# Patient Record
Sex: Male | Born: 1990
Health system: Southern US, Community
[De-identification: ages and names within clinical notes are randomized; demographics above are authoritative.]

## PROBLEM LIST (undated history)

## (undated) HISTORY — PX: HAND SURGERY: SHX662

---

## 2013-04-15 ENCOUNTER — Emergency Department: Payer: Self-pay | Admitting: Internal Medicine

## 2013-05-08 ENCOUNTER — Emergency Department: Payer: Self-pay | Admitting: Emergency Medicine

## 2013-05-11 LAB — BETA STREP CULTURE(ARMC)

## 2015-04-08 ENCOUNTER — Emergency Department: Payer: Self-pay

## 2015-04-08 ENCOUNTER — Encounter: Payer: Self-pay | Admitting: Emergency Medicine

## 2015-04-08 ENCOUNTER — Emergency Department
Admission: EM | Admit: 2015-04-08 | Discharge: 2015-04-09 | Disposition: A | Discharge status: Home / Self Care | Payer: Self-pay | Attending: Emergency Medicine | Admitting: Emergency Medicine

## 2015-04-08 DIAGNOSIS — W228XXA Striking against or struck by other objects, initial encounter: Secondary | ICD-10-CM | POA: Insufficient documentation

## 2015-04-08 DIAGNOSIS — Y92310 Basketball court as the place of occurrence of the external cause: Secondary | ICD-10-CM | POA: Insufficient documentation

## 2015-04-08 DIAGNOSIS — F17210 Nicotine dependence, cigarettes, uncomplicated: Secondary | ICD-10-CM | POA: Insufficient documentation

## 2015-04-08 DIAGNOSIS — S6991XA Unspecified injury of right wrist, hand and finger(s), initial encounter: Secondary | ICD-10-CM

## 2015-04-08 DIAGNOSIS — Y9367 Activity, basketball: Secondary | ICD-10-CM | POA: Insufficient documentation

## 2015-04-08 DIAGNOSIS — Y998 Other external cause status: Secondary | ICD-10-CM | POA: Insufficient documentation

## 2015-04-08 DIAGNOSIS — S61214A Laceration without foreign body of right ring finger without damage to nail, initial encounter: Secondary | ICD-10-CM | POA: Insufficient documentation

## 2015-04-08 DIAGNOSIS — S61209A Unspecified open wound of unspecified finger without damage to nail, initial encounter: Secondary | ICD-10-CM

## 2015-04-08 MED ORDER — KETOROLAC TROMETHAMINE 30 MG/ML IJ SOLN
10.0000 mg | Freq: Once | INTRAMUSCULAR | Status: AC
  Administered 2015-04-08: 9.9 mg via INTRAVENOUS
  Filled 2015-04-08: qty 1

## 2015-04-08 MED ORDER — IBUPROFEN 800 MG PO TABS: 800.0000 mg | ORAL_TABLET | Freq: Three times a day (TID) | ORAL | Status: DC | PRN

## 2015-04-08 MED ORDER — OXYCODONE-ACETAMINOPHEN 5-325 MG PO TABS: 1.0000 | ORAL_TABLET | ORAL | Status: DC | PRN

## 2015-04-08 MED ORDER — OXYCODONE-ACETAMINOPHEN 5-325 MG PO TABS
ORAL_TABLET | ORAL | Status: AC
  Filled 2015-04-08: qty 1

## 2015-04-08 MED ORDER — HYDROMORPHONE HCL 1 MG/ML IJ SOLN
0.5000 mg | Freq: Once | INTRAMUSCULAR | Status: AC
  Administered 2015-04-08: 0.5 mg via INTRAVENOUS
  Filled 2015-04-08: qty 1

## 2015-04-08 MED ORDER — SODIUM CHLORIDE 0.9 % IV BOLUS (SEPSIS)
1000.0000 mL | Freq: Once | INTRAVENOUS | Status: AC
  Administered 2015-04-08: 1000 mL via INTRAVENOUS

## 2015-04-08 MED ORDER — CEPHALEXIN 500 MG PO CAPS: 500.0000 mg | ORAL_CAPSULE | Freq: Three times a day (TID) | ORAL | Status: DC

## 2015-04-08 MED ORDER — OXYCODONE-ACETAMINOPHEN 5-325 MG PO TABS
1.0000 | ORAL_TABLET | Freq: Once | ORAL | Status: AC
  Administered 2015-04-08: 1 via ORAL

## 2015-04-08 MED ORDER — ONDANSETRON HCL 4 MG/2ML IJ SOLN
INTRAMUSCULAR | Status: DC
Start: 2015-04-08 — End: 2015-04-09
  Filled 2015-04-08: qty 2

## 2015-04-08 MED ORDER — CEFAZOLIN SODIUM 1-5 GM-% IV SOLN
1.0000 g | Freq: Once | INTRAVENOUS | Status: AC
  Administered 2015-04-08: 1 g via INTRAVENOUS
  Filled 2015-04-08: qty 50

## 2015-04-08 MED ORDER — MORPHINE SULFATE (PF) 4 MG/ML IV SOLN
4.0000 mg | Freq: Once | INTRAVENOUS | Status: AC
  Administered 2015-04-08: 4 mg via INTRAVENOUS

## 2015-04-08 MED ORDER — MORPHINE SULFATE (PF) 4 MG/ML IV SOLN
INTRAVENOUS | Status: AC
  Filled 2015-04-08: qty 1

## 2015-04-08 MED ORDER — ONDANSETRON HCL 4 MG/2ML IJ SOLN
4.0000 mg | Freq: Once | INTRAMUSCULAR | Status: AC
  Administered 2015-04-08: 4 mg via INTRAVENOUS
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
## 2015-04-08 NOTE — ED Notes (Signed)
MD Sung at bedside. 
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
## 2015-04-08 NOTE — ED Notes (Signed)
Patient reports he was attempting to dunk a basketball and his finger got caught.  Patient with injury to right 4th digit with bone exposure.

## 2015-04-08 NOTE — Discharge Instructions (Signed)
1. Take antibiotic as prescribed (Keflex 500 mg 3 times daily 7 days). 2. Take pain medicines as needed (Motrin/Percocet). 3. Keep wound clean and dry. 4. Return to the ER for wound check in 2 days if you are unable to get an appointment with the hand surgeon or orthopedic doctor within that timeframe. 5. Return to the ER sooner for worsening symptoms, fever, vomiting, redness, purulent discharge or swelling to your finger, or other concerns.  Deep Skin Avulsion A deep skin avulsion is a type of open wound. It often results from a severe injury (trauma) that tears away all layers of the skin or an entire body part. The areas of the body that are most often affected by a deep skin avulsion include the face, lips, ears, nose, and fingers. A deep skin avulsion may make structures below the skin become visible. You may be able to see muscle, bone, nerves, and blood vessels. A deep skin avulsion can also damage important structures beneath the skin. These include tendons, ligaments, nerves, or blood vessels. CAUSES Injuries that often cause a deep skin avulsion include:  Being crushed.  Falling against a jagged surface.  Animal bites.  Gunshot wounds.  Severe burns.  Injuries that involve being dragged, such as bicycle or motorcycle accidents. SYMPTOMS Symptoms of a deep skin avulsion include:  Pain.  Numbness.  Swelling.  A misshapen body part.  Bleeding, which may be heavy.  Fluid leaking from the wound. DIAGNOSIS This condition may be diagnosed with a medical history and physical exam. You may also have X-rays done. TREATMENT The treatment that is chosen for a deep skin avulsion depends on how large and deep the wound is and where it is located. Treatment for all types of avulsions usually starts with:  Controlling the bleeding.  Washing out the wound with a germ-free (sterile) salt-water solution.  Removing dead tissue from the wound. A wound may be closed or left open  to heal. This depends on the size and location of the wound and whether it is likely to become infected. Wounds are usually covered or closed if they expose blood vessels, nerves, bone, or cartilage.  Wounds that are small and clean may be closed with stitches (sutures).  Wounds that cannot be closed with sutures may be covered with a piece of skin (graft) or a skin flap. Skin may be taken from on or near the wound, from another part of the body, or from a donor.  Wounds may be left open if they are hard to close or they may become infected. These wounds heal over time from the bottom up. You may also receive medicine. This may include:  Antibiotics.  A tetanus shot.  Rabies vaccine. HOME CARE INSTRUCTIONS Medicines  Take or apply over-the-counter and prescription medicines only as told by your health care provider.  If you were prescribed an antibiotic, take or apply it as told by your health care provider. Do not stop taking the antibiotic even if your condition improves.  You may get anti-itch medicine while your wound is healing. Use it only as told by your health care provider. Wound Care  There are many ways to close and cover a wound. For example, a wound can be covered with sutures, skin glue, or adhesive strips. Follow instructions from your health care provider about:  How to take care of your wound.  When and how you should change your bandage (dressing).  When you should remove your dressing.  Removing whatever was  used to close your wound.  Keep the dressing dry as told by your health care provider. Do not take baths, swim, use a hot tub, or do anything that would put your wound underwater until your health care provider approves.  Clean the wound each day or as told by your health care provider.  Wash the wound with mild soap and water.  Rinse the wound with water to remove all soap.  Pat the wound dry with a clean towel. Do not rub it.  Do not scratch or  pick at the wound.  Check your wound every day for signs of infection. Watch for:  Redness, swelling, or pain.  Fluid, blood, or pus. General Instructions  Raise (elevate) the injured area above the level of your heart while you are sitting or lying down.  Keep all follow-up visits as told by your health care provider. This is important. SEEK MEDICAL CARE IF:  You received a tetanus shot and you have swelling, severe pain, redness, or bleeding at the injection site.  You have a fever.  Your pain is not controlled with medicine.  You have increased redness, swelling, or pain at the site of your wound.  You have fluid, blood, or pus coming from your wound.  You notice a bad smell coming from your wound or your dressing.  A wound that was closed breaks open.  You notice something coming out of the wound, such as wood or glass.  You notice a change in the color of your skin near your wound.  You develop a new rash.  You need to change the dressing frequently due to fluid, blood, or pus draining from the wound. SEEK IMMEDIATE MEDICAL CARE IF:  Your pain suddenly increases and is severe.  You develop severe swelling around the wound.  You develop numbness around the wound.  You have nausea and vomiting that does not go away after 24 hours.  You feel light-headed, weak, or faint.  You develop chest pain.  You have trouble breathing.  Your wound is on your hand or foot and you cannot properly move a finger or toe.  The wound is on your hand or foot and you notice that your fingers or toes look pale or bluish.  You have a red streak going away from your wound.   This information is not intended to replace advice given to you by your health care provider. Make sure you discuss any questions you have with your health care provider.   Document Released: 04/09/2006 Document Revised: 06/28/2014 Document Reviewed: 02/16/2014 Elsevier Interactive Patient Education 2016  [employer]  Wound Care Taking care of your wound properly can help to prevent pain and infection. It can also help your wound to heal more quickly.  HOW TO CARE FOR YOUR WOUND  Take or apply over-the-counter and prescription medicines only as told by your health care provider.  If you were prescribed antibiotic medicine, take or apply it as told by your health care provider. Do not stop using the antibiotic even if your condition improves.  Clean the wound each day or as told by your health care provider.  Wash the wound with mild soap and water.  Rinse the wound with water to remove all soap.  Pat the wound dry with a clean towel. Do not rub it.  There are many different ways to close and cover a wound. For example, a wound can be covered with stitches (sutures), skin glue, or adhesive strips. Follow  instructions from your health care provider about:  How to take care of your wound.  When and how you should change your bandage (dressing).  When you should remove your dressing.  Removing whatever was used to close your wound.  Check your wound every day for signs of infection. Watch for:  Redness, swelling, or pain.  Fluid, blood, or pus.  Keep the dressing dry until your health care provider says it can be removed. Do not take baths, swim, use a hot tub, or do anything that would put your wound underwater until your health care provider approves.  Raise (elevate) the injured area above the level of your heart while you are sitting or lying down.  Do not scratch or pick at the wound.  Keep all follow-up visits as told by your health care provider. This is important. SEEK MEDICAL CARE IF:  You received a tetanus shot and you have swelling, severe pain, redness, or bleeding at the injection site.  You have a fever.  Your pain is not controlled with medicine.  You have increased redness, swelling, or pain at the site of your wound.  You have fluid, blood, or pus  coming from your wound.  You notice a bad smell coming from your wound or your dressing. SEEK IMMEDIATE MEDICAL CARE IF:  You have a red streak going away from your wound.   This information is not intended to replace advice given to you by your health care provider. Make sure you discuss any questions you have with your health care provider.   Document Released: 11/21/2007 Document Revised: 06/28/2014 Document Reviewed: 02/07/2014 Elsevier Interactive Patient Education 2016 Else[employer] Inc.  Traumatic Finger Amputation A traumatic finger amputation is when you lose part or all of a finger because of an accident or injury. The severity of this type of injury can vary widely. It can mean that just the tip of your finger gets ripped off (avulsion), or it can mean that you completely lose a finger (amputation). Traumatic finger amputation is a medical emergency. It requires immediate care to prevent further damage and to save the finger. CAUSES A traumatic finger amputation usually results from an accident that involves:  A car.  Power tools.  Factory work.  Farm or Risk manager. SYMPTOMS  Symptoms of a traumatic finger amputation include:  Bleeding.  Pain.  Damage to surrounding tissues, such as bones, muscles, tendons, and skin. Severe injuries can sometimes lead to shock, which is a life-threatening condition in which your body fails to get blood, oxygen, and nutrients to vital organs. Some common symptoms of shock include low blood pressure, sweating, weakness, and pale skin. DIAGNOSIS Your health care provider will do a physical exam and ask for details about how your injury occurred. The physical exam will help your health care provider to determine the severity of the injury and the best way to repair it. X-rays may be done to check for damage to the surrounding bones and tissues. TREATMENT Treatment depends on the type of injury that you have and how bad it is.  Your  health care provider will clean the wound thoroughly and apply a medicine (anesthetic) to relieve pain.  If just the tip of your finger was removed, the wound will typically heal on its own with a protective bandage (dressing) and regular cleaning.  For more severe injuries, a portion of skin may need to be taken from another part of the body (graft) and attached to the wound site until  the wound heals.  If a large portion of the finger was amputated, it may be possible to reattach it surgically (replantation). HOME CARE INSTRUCTIONS  Take medicines only as directed by your health care provider.  If you were prescribed an antibiotic medicine, finish all of it even if you start to feel better.  There are many different ways to close and cover a wound, including stitches (sutures), skin glue, and adhesive strips. Follow your health care provider's instructions about:  Wound care.  Dressing changes and removal.  Wound closure removal.  Check your wound every day for signs of infection. Watch for:  Redness, swelling, or pain.  Fluid, blood, or pus.  Do exercises to strengthen your finger and hand as directed by your health care provider. SEEK MEDICAL CARE IF:  Your wound does not seem to be healing well.  You have redness, swelling, or pain at your wound site.  You have fluid, blood, or pus coming from your wound.  You have a fever.   This information is not intended to replace advice given to you by your health care provider. Make sure you discuss any questions you have with your health care provider.   Document Released: 04/22/2001 Document Revised: 03/04/2014 Document Reviewed: 10/27/2013 Elsevier Interactive Patient Education [employer]

## 2015-04-09 MED ORDER — OXYCODONE-ACETAMINOPHEN 5-325 MG PO TABS
1.0000 | ORAL_TABLET | Freq: Once | ORAL | Status: AC
  Administered 2015-04-09: 1 via ORAL
  Filled 2015-04-09: qty 1

## 2015-04-09 NOTE — ED Notes (Signed)
Pr requested food/drink. MD sung approved food/drink for pt. Brought pt food and drink

## 2015-04-09 NOTE — ED Notes (Signed)
Irrigated wound/finger. Placed xeroform gauze, and dressed wound/finger.

## 2015-04-09 NOTE — ED Notes (Signed)
Reviewed d/c instructions, wound care, prescriptions, need to come back in 2 days for follow-up to ED or listed providers, and use of elevation. Reviewed signs of infections. Reviewed need to keep wound clean and dry. Pt verbalized understanding.

## 2015-04-09 NOTE — ED Notes (Signed)
Placed gauze wrapping around pt's finger

## 2015-04-12 ENCOUNTER — Encounter: Payer: Self-pay | Admitting: Emergency Medicine

## 2015-04-12 ENCOUNTER — Emergency Department
Admission: EM | Admit: 2015-04-12 | Discharge: 2015-04-12 | Disposition: A | Discharge status: Home / Self Care | Payer: Self-pay | Attending: Emergency Medicine | Admitting: Emergency Medicine

## 2015-04-12 DIAGNOSIS — S61209D Unspecified open wound of unspecified finger without damage to nail, subsequent encounter: Secondary | ICD-10-CM

## 2015-04-12 DIAGNOSIS — W2105XD Struck by basketball, subsequent encounter: Secondary | ICD-10-CM | POA: Insufficient documentation

## 2015-04-12 DIAGNOSIS — Z792 Long term (current) use of antibiotics: Secondary | ICD-10-CM | POA: Insufficient documentation

## 2015-04-12 DIAGNOSIS — S61214D Laceration without foreign body of right ring finger without damage to nail, subsequent encounter: Secondary | ICD-10-CM | POA: Insufficient documentation

## 2015-04-12 DIAGNOSIS — F17210 Nicotine dependence, cigarettes, uncomplicated: Secondary | ICD-10-CM | POA: Insufficient documentation

## 2015-04-12 MED ORDER — OXYCODONE-ACETAMINOPHEN 5-325 MG PO TABS: 1.0000 | ORAL_TABLET | ORAL | Status: DC | PRN

## 2015-04-12 MED ORDER — OXYCODONE-ACETAMINOPHEN 5-325 MG PO TABS
1.0000 | ORAL_TABLET | Freq: Once | ORAL | Status: AC
  Administered 2015-04-12: 1 via ORAL
  Filled 2015-04-12: qty 1
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
## 2015-04-12 NOTE — Discharge Instructions (Signed)
Keep area clean and dry. Change dressing daily. Continue taking pain medication only as directed. Call Dr. Samuel Germany office today to make an appointment for reevaluation. Left them know that you were seen in the emergency room both Saturday and today.

## 2015-04-12 NOTE — ED Notes (Signed)
Partial avulsion to 2nd digit right hand.  Tissue appears red.  Pt cannot straighten fingers at baseline.

## 2015-04-12 NOTE — ED Notes (Signed)
Pt reports injured ring finger on right hand Saturday. Was told to see ortho specialist and they were told they do not accept pts without insurance.  Pt is here for recheck. Still has pain in finger.

## 2015-04-16 ENCOUNTER — Encounter: Payer: Self-pay | Admitting: Emergency Medicine

## 2015-04-16 ENCOUNTER — Emergency Department
Admission: EM | Admit: 2015-04-16 | Discharge: 2015-04-16 | Disposition: A | Discharge status: Home / Self Care | Payer: Self-pay | Attending: Emergency Medicine | Admitting: Emergency Medicine

## 2015-04-16 DIAGNOSIS — F17210 Nicotine dependence, cigarettes, uncomplicated: Secondary | ICD-10-CM | POA: Insufficient documentation

## 2015-04-16 DIAGNOSIS — Z4801 Encounter for change or removal of surgical wound dressing: Secondary | ICD-10-CM | POA: Insufficient documentation

## 2015-04-16 DIAGNOSIS — Z5189 Encounter for other specified aftercare: Secondary | ICD-10-CM

## 2015-04-16 DIAGNOSIS — Z792 Long term (current) use of antibiotics: Secondary | ICD-10-CM | POA: Insufficient documentation

## 2015-04-16 MED ORDER — OXYCODONE-ACETAMINOPHEN 5-325 MG PO TABS: 1.0000 | ORAL_TABLET | Freq: Four times a day (QID) | ORAL | Status: DC | PRN
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
## 2015-04-16 NOTE — ED Notes (Signed)
Pt had finger injury a about 9 days ago to bone. Has appt with specialist Wednesday.  Reports he got finger wet in shower and would like re-evaluated.  Also would like pain medication to get him through to Wednesday.  Was seen back in ED after initial injury as well bc said could not afford specialist at that time.

## 2015-04-16 NOTE — Discharge Instructions (Signed)
Keep your Appointment with [facility] in the medical arts building. This prescription for Percocet should last year until your appointment with Dr. Martha Clan or Dr. Hyacinth Meeker. Keep dressing clean and dry. Finger is healing without any signs of infection.

## 2016-12-08 ENCOUNTER — Emergency Department
Admission: EM | Admit: 2016-12-08 | Discharge: 2016-12-08 | Disposition: A | Discharge status: Home / Self Care | Payer: Self-pay | Attending: Emergency Medicine | Admitting: Emergency Medicine

## 2016-12-08 ENCOUNTER — Encounter: Payer: Self-pay | Admitting: Emergency Medicine

## 2016-12-08 DIAGNOSIS — M546 Pain in thoracic spine: Secondary | ICD-10-CM | POA: Insufficient documentation

## 2016-12-08 DIAGNOSIS — Z79899 Other long term (current) drug therapy: Secondary | ICD-10-CM | POA: Insufficient documentation

## 2016-12-08 DIAGNOSIS — F17210 Nicotine dependence, cigarettes, uncomplicated: Secondary | ICD-10-CM | POA: Insufficient documentation

## 2016-12-08 DIAGNOSIS — M545 Low back pain: Secondary | ICD-10-CM | POA: Insufficient documentation

## 2016-12-08 MED ORDER — ORPHENADRINE CITRATE 30 MG/ML IJ SOLN
60.0000 mg | INTRAMUSCULAR | Status: AC
  Administered 2016-12-08: 60 mg via INTRAMUSCULAR
  Filled 2016-12-08: qty 2

## 2016-12-08 MED ORDER — CYCLOBENZAPRINE HCL 5 MG PO TABS: 5.0000 mg | ORAL_TABLET | Freq: Three times a day (TID) | ORAL | 0 refills | Status: DC | PRN

## 2016-12-08 MED ORDER — KETOROLAC TROMETHAMINE 10 MG PO TABS: 10.0000 mg | ORAL_TABLET | Freq: Three times a day (TID) | ORAL | 0 refills | Status: DC

## 2016-12-08 MED ORDER — KETOROLAC TROMETHAMINE 30 MG/ML IJ SOLN
30.0000 mg | Freq: Once | INTRAMUSCULAR | Status: AC
Start: 2016-12-08 — End: 2016-12-08
  Administered 2016-12-08: 30 mg via INTRAMUSCULAR
  Filled 2016-12-08: qty 1
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
## 2016-12-08 NOTE — ED Triage Notes (Signed)
Has chronic back pain from football injury. Worse over last week after sleeping on friends couch.  Worse when moves. If wears back brace feels better.

## 2016-12-08 NOTE — Discharge Instructions (Signed)
Your exam is essentially normal at this time. You have symptoms and an exam that support muscle strain and spasms. Take the prescription meds as directed. Apply ice and moist heat to treat pain. Return to your local urgent care center or see ortho for ongoing symptoms. Return to the ED as needed.

## 2017-08-09 IMAGING — CR DG FINGER RING 2+V*R*
1 series · 4 of 4 positions shown · non-contrast
Comparison: None.

CLINICAL DATA: Injury to distal 4th digit with laceration

EXAM:
RIGHT RING FINGER 2+V

[Series 1: oblique · 0.17mm/px · 4 of 4 slices shown]
[im 1/4]
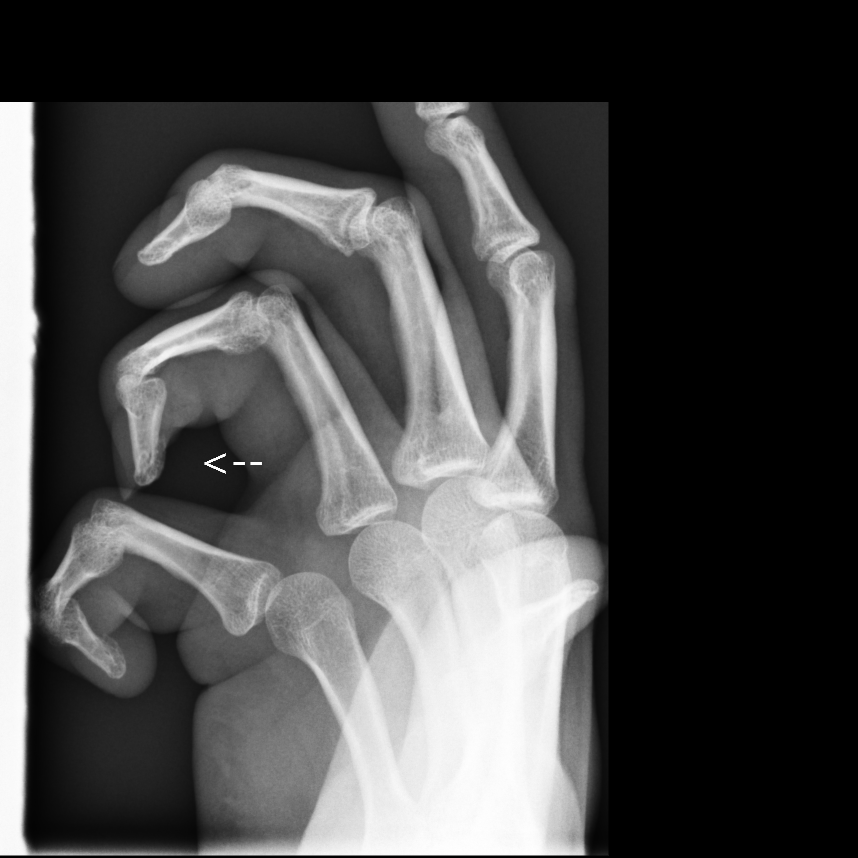
[im 2/4]
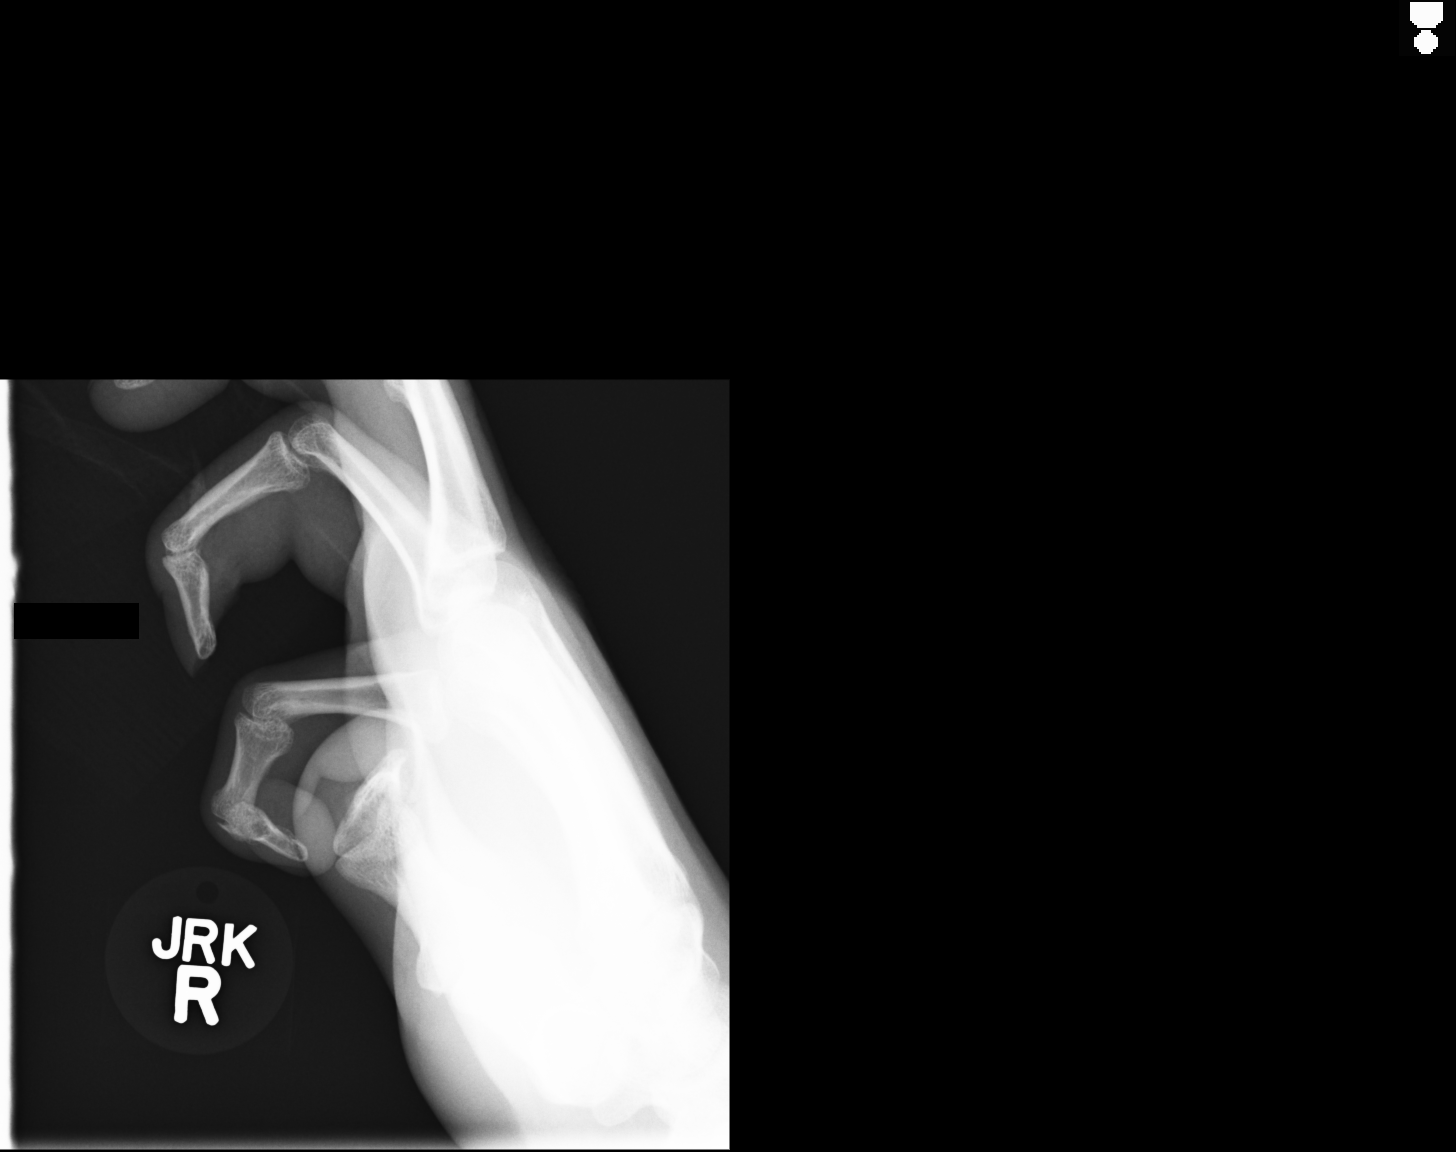
[im 3/4]
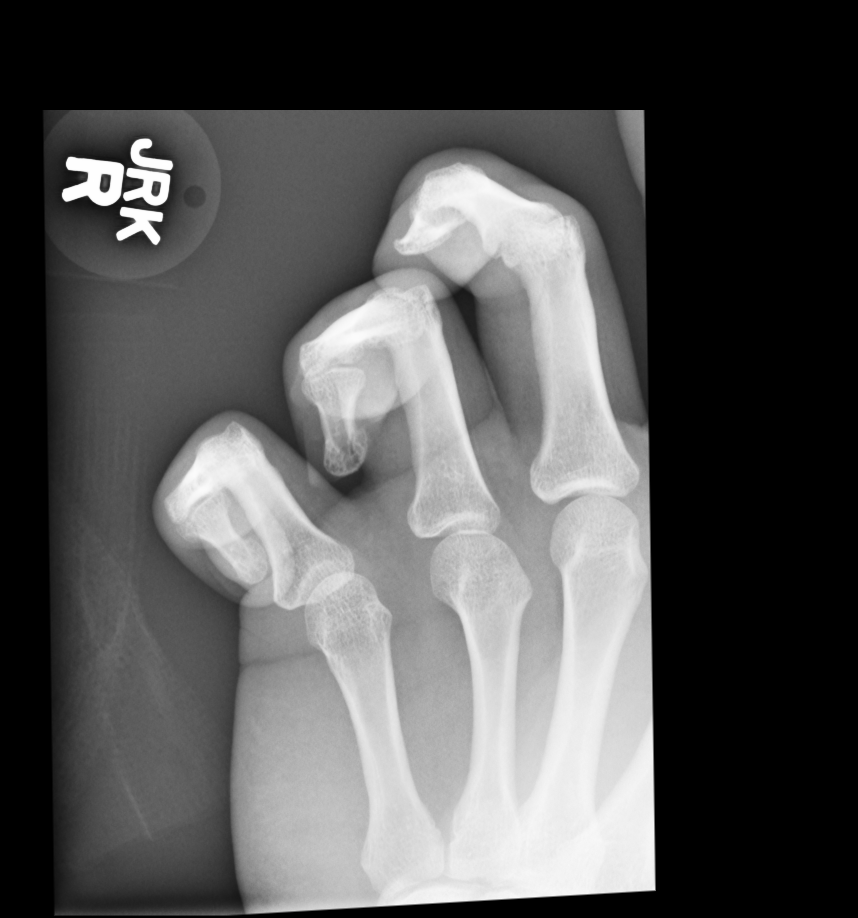
[im 4/4]
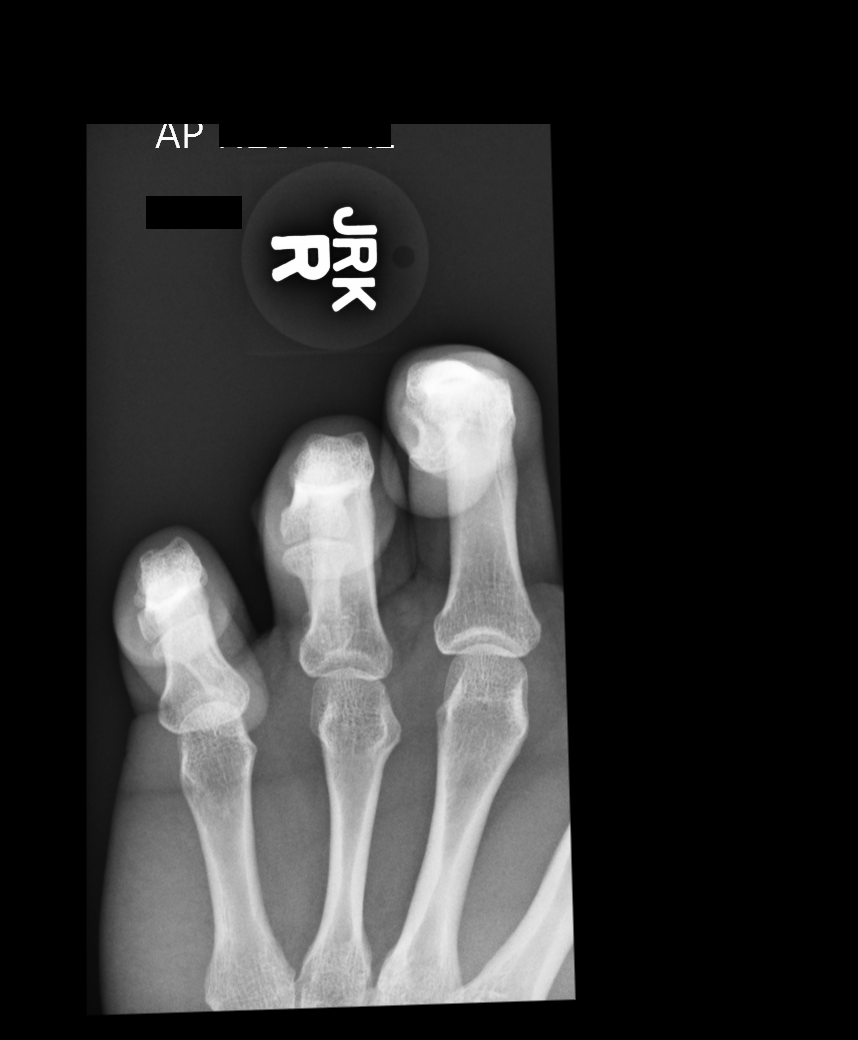

[4 of 4 positions shown; findings below may reference images not displayed]

FINDINGS: Flexion deformity of the 3rd through 5th digits.

Soft tissue amputation involving the distal 4th digit overlying the
distal phalanx. Distal tuft appears exposed.

No evidence of fracture or dislocation.

No radiopaque foreign body is seen.
IMPRESSION: Soft tissue amputation involving the distal 4th digit overlying the
distal phalanx.

No evidence of fracture or dislocation.

No radiopaque foreign body is seen.

## 2018-01-06 DIAGNOSIS — Z23 Encounter for immunization: Secondary | ICD-10-CM | POA: Diagnosis not present

## 2018-01-08 DIAGNOSIS — R634 Abnormal weight loss: Secondary | ICD-10-CM | POA: Diagnosis not present

## 2018-01-08 DIAGNOSIS — Z00110 Health examination for newborn under 8 days old: Secondary | ICD-10-CM | POA: Diagnosis not present

## 2018-01-09 DIAGNOSIS — Z00110 Health examination for newborn under 8 days old: Secondary | ICD-10-CM | POA: Diagnosis not present

## 2018-01-09 DIAGNOSIS — R634 Abnormal weight loss: Secondary | ICD-10-CM | POA: Diagnosis not present

## 2018-01-16 DIAGNOSIS — H1011 Acute atopic conjunctivitis, right eye: Secondary | ICD-10-CM | POA: Diagnosis not present

## 2019-02-25 ENCOUNTER — Ambulatory Visit: Payer: Self-pay | Admitting: Adult Health Nurse Practitioner

## 2019-03-05 ENCOUNTER — Encounter: Payer: Self-pay | Admitting: Adult Health Nurse Practitioner

## 2019-03-05 ENCOUNTER — Other Ambulatory Visit: Payer: Self-pay

## 2019-03-05 ENCOUNTER — Ambulatory Visit (INDEPENDENT_AMBULATORY_CARE_PROVIDER_SITE_OTHER): Payer: BC Managed Care – PPO | Admitting: Adult Health Nurse Practitioner

## 2019-03-05 DIAGNOSIS — Z720 Tobacco use: Secondary | ICD-10-CM

## 2019-03-05 DIAGNOSIS — I4949 Other premature depolarization: Secondary | ICD-10-CM | POA: Diagnosis not present

## 2019-03-05 DIAGNOSIS — R454 Irritability and anger: Secondary | ICD-10-CM | POA: Diagnosis not present

## 2019-03-05 DIAGNOSIS — G479 Sleep disorder, unspecified: Secondary | ICD-10-CM

## 2019-03-05 DIAGNOSIS — F17200 Nicotine dependence, unspecified, uncomplicated: Secondary | ICD-10-CM

## 2019-03-05 DIAGNOSIS — F439 Reaction to severe stress, unspecified: Secondary | ICD-10-CM

## 2019-03-05 HISTORY — DX: Sleep disorder, unspecified: G47.9

## 2019-03-05 HISTORY — DX: Irritability and anger: R45.4

## 2019-03-05 HISTORY — DX: Nicotine dependence, unspecified, uncomplicated: F17.200

## 2019-03-05 HISTORY — DX: Reaction to severe stress, unspecified: F43.9

## 2019-03-05 HISTORY — DX: Other premature depolarization: I49.49

## 2019-03-05 MED ORDER — CLONAZEPAM 0.5 MG PO TABS: 0.5000 mg | ORAL_TABLET | Freq: Two times a day (BID) | ORAL | 1 refills | Status: DC | PRN

## 2019-03-05 MED ORDER — QUETIAPINE FUMARATE 100 MG PO TABS: ORAL_TABLET | ORAL | 1 refills | Status: DC

## 2019-03-05 NOTE — Progress Notes (Signed)
Chief Complaint  Patient presents with  . Establish Care    Anxiety  . score 18    HPI.    Patient presents today for anxiety and borderline for depression.  He has noticed over the past few months that he gets very irritated easily.  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  He is often tired when returning home from work.  Wife works from home and feels somewhat ignored at times.  Both are stressed taking care of children that are ages 70, 40, and 1.  He notes that he has had some problems with anger in the past and more so recently with the stress of Covid and at home.  He is trying to work on some of his anger issues.  Self medicates with THC.  Drinks some but not significantly.  He is getting some physical signs of anger and anxiety with increased heart rate, increased pulse rate, and overall fight or flight response.  He is amenable to starting medication for anxiety and depression.  Also is not sleeping well.  He has had a cyst history of sleep difficulty at times and right now it is worse.  He is also not feeling recovered and is always tired. Problem List    Problem List: 2021-01: Irritability and anger 2021-01: Ectopic cardiac beats 2021-01: Current tobacco use 2021-01: Stress at home 2021-01: Sleep disorder   Allergies   has No Known Allergies.  Medications    Current Outpatient Medications:  .  ibuprofen (ADVIL,MOTRIN) 800 MG tablet, Take 1 tablet (800 mg total) by mouth every 8 (eight) hours as needed for moderate pain., Disp: 15 tablet, Rfl: 0 .  clonazePAM (KLONOPIN) 0.5 MG tablet, Take 1 tablet (0.5 mg total) by mouth 2 (two) times daily as needed for anxiety., Disp: 30 tablet, Rfl: 1 .  QUEtiapine (SEROQUEL) 100 MG tablet, 1/2 tab for 5-7 days then increase to '100mg'$  qhs, Disp: 30 tablet, Rfl: 1   Review of Systems    Constitutional: Negative for activity change, appetite change, chills and fever.  HENT: Negative for  congestion, nosebleeds, trouble swallowing and voice change.   Respiratory: Negative for cough, shortness of breath and wheezing.   Gastrointestinal: Negative for diarrhea, nausea and vomiting.  Genitourinary: Negative for difficulty urinating, dysuria, flank pain and hematuria.  Musculoskeletal: Negative for back pain, joint swelling and neck pain.  Neurological: Negative for dizziness, speech difficulty, light-headedness and numbness.  Mental:  + for anxiety, anger, irritability.  Negative for suicide ideation  See HPI. All other review of systems negative.     Physical Exam:   Physical Examination: General appearance - alert, well appearing, and in no distress and oriented to person, place, and time Mental status - normal mood, behavior, speech, dress, motor activity, and thought processes Eyes - EOM. PERRL.  Neck - supple, no significant adenopathy, carotids upstroke normal bilaterally, no bruits, thyroid exam: thyroid is normal in size without nodules or tenderness Chest - clear to auscultation, no wheezes, rales or rhonchi, symmetric air entry  Heart - normal rate, regular rhythm, normal S1, S2, no murmurs, rubs, clicks or gallops Extremities - dependent LE edema without clubbing or cyanosis Skin - normal coloration and turgor, no rashes, no suspicious skin lesions noted  No hyperpigmentation of skin.  No current hematomas noted   Lab Review   orders written for new lab studies as appropriate; see orders.   Assessment & Plan:  NICODEMUS DENK is a 29 y.o. male . 1. Irritability and anger   2. Ectopic cardiac beats   3. Current tobacco use   4. Stress at home   5. Sleep disorder    Orders Placed This Encounter  Procedures  . CMP14+EGFR  . TSH  . Ambulatory referral to Psychology  . POCT CBC   Meds ordered this encounter  Medications  . QUEtiapine (SEROQUEL) 100 MG tablet    Sig: 1/2 tab for 5-7 days then increase to '100mg'$  qhs    Dispense:  30 tablet    Refill:  1  .  clonazePAM (KLONOPIN) 0.5 MG tablet    Sig: Take 1 tablet (0.5 mg total) by mouth 2 (two) times daily as needed for anxiety.    Dispense:  30 tablet    Refill:  1   Will try the above to determine if it helps his sleep, anxiety, and anger.  Based on how he does on this will determine if adding an antidepressant is reasonable.  We discussed limitations and side effects of the medication.  Recommended counseling for he and his wife and he will discussed with her.  Follow-up in 1 month.  He is in line with this plan.  Glyn Ade, NP  A total of 45 minutes were spent face-to-face with the patient during this encounter and over half of that time was spent on counseling and coordination of care.

## 2019-03-05 NOTE — Patient Instructions (Addendum)
If you have lab work done today you will be contacted with your lab results within the next 2 weeks.  If you have not heard from [place] then please contact us. The fastest way to get your results is to register for My Chart.   IF you received an x-ray today, you will receive an invoice from [facility]. Please contact [facility] at [phone] with questions or concerns regarding your invoice.   IF you received labwork today, you will receive an invoice from [facility]. Please contact [facility] at [phone] with questions or concerns regarding your invoice.   Our billing staff will not be able to assist you with questions regarding bills from these companies.  You will be contacted with the lab results as soon as they are available. The fastest way to get your results is to activate your My Chart account. Instructions are located on the last page of this paperwork. If you have not heard from [place] regarding the results in 2 weeks, please contact this office.     Managing Anger, Adult Everyone feels angry from time to time. It is okay and normal to feel angry. However, the way that you behave or react to anger can make it a problem. How can anger affect me? Reacting too strongly to anger, acting out in anger, or not expressing it at all can:  Cause relationship problems at home and work.  Trigger stress-related problems, such as headaches, poor digestion, or trouble sleeping.  Affect your health. Uncontrolled anger increases your risk of heart disease. When you are angry, your heart rate and blood pressure rise. Levels of certain hormones, such as adrenaline, also increase. When this happens, your heart has to work harder. In extreme cases, anger can cause the blood vessels to become narrow. This reduces the supply of blood and oxygen to the heart, and that can trigger chest pain (angina). What actions can I take to manage my anger?     You can take actions to help you  manage your anger. For example:  Express your anger in a healthy way by using the following strategies: ? Step away. When you are feeling reactive, it may take at least 20 minutes for your body to return to its normal blood pressure and heart rate. To help your body do this, take a walk, listen to music, stretch, take deep breaths, and avoid the person or situation that has you feeling angry. Try to discuss your anger when you feel calm again. ? Consider how others may feel before you react. Avoid swearing, sighing, raising your voice, or blaming. ? Realize that you can have the feeling of anger, but you do not have to become the feeling. All feelings happen and then weaken over time. ? Choose a good time to work through problems and reach agreement with the other person. You may be more likely to lose your temper at the end of the day when you are tired. The other person may also still be reactive for a while. Set a time and place to come back together that works for both of you. ? Keep a journal of your feelings. Write down situations in which you become angry. This helps you know your feelings and may help you figure out what triggers your anger.  Consider changing your perception. Is there another way you can view the situation that will leave you with a different emotion? Sometimes, changing the way you think about a situation can make  it seem less infuriating. Here are some ways to do that: ? Remind yourself that everyone is not out to get you. ? Remind yourself that a disappointing result is not the end of the world. You can cope with being disappointed. ? Take steps to solve or prevent the situation that upsets you. ? Find the humor in an aggravating situation. ? Deal with the physical effects of anger by taking deep breaths, exercising, or taking a walk. ? Slowly repeat the word "relax" or another calming phrase. ? Practice letting go of whatever is making you angry. Realize that many things  that may anger you are outside of your control and you cannot do anything about them. ? Picture a relaxing image in your mind. Close your eyes and use that image to help you calm yourself. What are signs of an anger-related problem? Anger becomes a problem if it occurs frequently and lasts for long periods of time. You may also need help managing your anger if:  You use physical force or aggression when you are angry and others around you feel threatened and fearful.  You feel that your anger is out of control.  Anger is interfering with your job.  Anger is causing problems with your health.  Anger is causing problems with your relationships.  Anger is affecting your ability to tolerate normal daily situations, such as sitting in a traffic jam or waiting in line.  You treat others disrespectfully.  You do not trust people around you. It may help to ask someone you trust whether he or she thinks you show any of these signs. Sometimes, it can be hard to recognize the problem yourself. Where to find support To find support on how to manage your anger:  Contact a licensed mental health professional directly.  Call your health care provider for a referral to a mental health professional.  Look online to find a psychologist who specializes in anger management.  Search websites of mental health organizations to find a mental health care provider.  Look online for a local office of the Domestic Abuse Project. Your local hospital or behavioral counselors in your area may also offer anger management programs or support groups that can help. Where to find more information  American Psychological Association: TVStereos.ch  National Resource Center on Domestic Violence: GreenSwimming.be Summary  It is normal for everyone to feel angry at times. However, anger becomes a problem if it occurs frequently and lasts for long periods of time.  Health and relationship problems can develop when you  react too strongly to anger, act out in anger, or do not express your anger at all.  You can use strategies to help you express your anger in a healthy way.  Learn to change your perception. Sometimes, changing the way you think about a situation can make it seem less infuriating.  Contact your health care provider or a mental health professional if you need help managing your anger. This information is not intended to replace advice given to you by your health care provider. Make sure you discuss any questions you have with your health care provider. Document Revised: 02/10/2018 Document Reviewed: 02/10/2018 Elsevier Patient Education  [place].  Smoking and Musculoskeletal Health Smoking is bad for your health. Most people know that smoking causes lung disease, heart disease, and cancer. But people may not realize that it also affects their bones, muscles, and joints (musculoskeletal system). When you smoke, the effects on your lungs and heart result  in less oxygen for your musculoskeletal system. This can lead to poor bone and joint health. How can smoking affect my musculoskeletal health? Smoking can:  Increase your risk of having weak, thin bones (osteoporosis). Elderly smokers are at higher risk for bone fractures related to osteoporosis.  Decrease the ability of bone-forming cells to make and replace bone (in addition to reducing oxygen and blood flow).  Reduce your body's ability to absorb calcium from your diet. Less calcium means weaker bones.  Interfere with the breakdown of the male hormone estrogen. Smoking lowers estrogen, which is a hormone that helps keep bones strong. Women who smoke may have earlier menopause. Menopause is a risk factor for osteoporosis.  Weaken the tissues that attach bones to muscles (tendons). This can lead to shoulder, back, and other joint injuries.  Increase your risk of rheumatoid arthritis or make the condition worse if you already have  it.  Slow down healing and increase your risk of infection and other complications if you have a bone fracture or surgery that involves your musculoskeletal system.  Make you get out of breath easily. This can keep you from getting the exercise you need to keep your bones and joints healthy.  Decrease your appetite and body mass. You may lose weight and muscle strength. This can put you at higher risk for muscle injury, joint injury, and broken bones. What actions can I take to prevent musculoskeletal problems? Quit smoking      Do not start smoking. Quit if you already do. Even stopping later in life can improve musculoskeletal health.  Do not use any products that contain nicotine or tobacco. Do not replace cigarette smoking with e-cigarettes. The safety of e-cigarettes is not known, and some may contain harmful chemicals.  Make a plan to quit smoking and commit to it. Look for programs to help you, and ask your health care provider for recommendations and ideas.  Talk with your health care provider about using nicotine replacement medicines to help you quit, such as gum, lozenges, patches, sprays, or pills. Make other lifestyle changes   Eat a healthy diet that includes calcium and vitamin D. These nutrients are important for bone health. ? Calcium is found in dairy foods and green leafy vegetables. ? Vitamin D is found in eggs, fish, and liver. ? Many foods also have vitamin D and calcium added to them (are fortified). ? Ask your health care provider if you would benefit from taking a supplement.  Get out in the sunshine for a short time every day. This increases production of vitamin D.  Get 30 minutes of exercise at least 5 days a week. Weight-bearing and strength exercises are best for musculoskeletal health. Ask your health care provider what type of exercise is safe for you.  Do not drink alcohol if: ? Your health care provider tells you not to drink. ? You are pregnant,  may be pregnant, or are planning to become pregnant.  If you drink alcohol, limit how much you have: ? 0-1 drink a day for women. ? 0-2 drinks a day for men.  Be aware of how much alcohol is in your drink. In the [place] one drink equals one 12 oz bottle of beer (355 mL), one 5 oz glass of wine (148 mL), or one 1 oz glass of hard liquor (44 mL). Where to find more information You may find more information about smoking, musculoskeletal health, and quitting smoking from:  [place] [employer]: orthoinfo.aaos.org  [employer] of Health, Osteoporosis and [place]: bones.SouthExposed.es  HelpGuide.org: helpguide.org  https://hall.com/: smokefree.gov  American Lung Association: lung.org Contact a health care provider if:  You need help to quit smoking. Summary  When you smoke, the effects on your lungs and heart result in less oxygen for your musculoskeletal system.  Even stopping smoking later in life can improve musculoskeletal health.  Do not use any products that contain nicotine or tobacco, such as cigarettes and e-cigarettes.  If you need help quitting, ask your health care provider. This information is not intended to replace advice given to you by your health care provider. Make sure you discuss any questions you have with your health care provider. Document Revised: 11/06/2018 Document Reviewed: 06/09/2017 Elsevier Patient Education  [place] with Quitting Smoking  Quitting smoking is a physical and mental challenge. You will face cravings, withdrawal symptoms, and temptation. Before quitting, work with your health care provider to make a plan that can help you cope. Preparation can help you quit and keep you from giving in. How can I cope with cravings? Cravings usually last for 5-10 minutes. If you get through it, the craving will pass. Consider taking the following actions to help you cope with  cravings:  Keep your mouth busy: ? Chew sugar-free gum. ? Suck on hard candies or a straw. ? Brush your teeth.  Keep your hands and body busy: ? Immediately change to a different activity when you feel a craving. ? Squeeze or play with a ball. ? Do an activity or a hobby, like making bead jewelry, practicing needlepoint, or working with wood. ? Mix up your normal routine. ? Take a short exercise break. Go for a quick walk or run up and down stairs. ? Spend time in public places where smoking is not allowed.  Focus on doing something kind or helpful for someone else.  Call a friend or family member to talk during a craving.  Join a support group.  Call a quit line, such as 1-800-QUIT-NOW.  Talk with your health care provider about medicines that might help you cope with cravings and make quitting easier for you. How can I deal with withdrawal symptoms? Your body may experience negative effects as it tries to get used to not having nicotine in the system. These effects are called withdrawal symptoms. They may include:  Feeling hungrier than normal.  Trouble concentrating.  Irritability.  Trouble sleeping.  Feeling depressed.  Restlessness and agitation.  Craving a cigarette. To manage withdrawal symptoms:  Avoid places, people, and activities that trigger your cravings.  Remember why you want to quit.  Get plenty of sleep.  Avoid coffee and other caffeinated drinks. These may worsen some of your symptoms. How can I handle social situations? Social situations can be difficult when you are quitting smoking, especially in the first few weeks. To manage this, you can:  Avoid parties, bars, and other social situations where people might be smoking.  Avoid alcohol.  Leave right away if you have the urge to smoke.  Explain to your family and friends that you are quitting smoking. Ask for understanding and support.  Plan activities with friends or family where  smoking is not an option. What are some ways I can cope with stress? Wanting to smoke may cause stress, and stress can make you want to smoke. Find ways to manage your stress. Relaxation techniques can help. For example:  Breathe slowly and deeply,  in through your nose and out through your mouth.  Listen to soothing, relaxing music.  Talk with a family member or friend about your stress.  Light a candle.  Soak in a bath or take a shower.  Think about a peaceful place. What are some ways I can prevent weight gain? Be aware that many people gain weight after they quit smoking. However, not everyone does. To keep from gaining weight, have a plan in place before you quit and stick to the plan after you quit. Your plan should include:  Having healthy snacks. When you have a craving, it may help to: ? Eat plain popcorn, crunchy carrots, celery, or other cut vegetables. ? Chew sugar-free gum.  Changing how you eat: ? Eat small portion sizes at meals. ? Eat 4-6 small meals throughout the day instead of 1-2 large meals a day. ? Be mindful when you eat. Do not watch television or do other things that might distract you as you eat.  Exercising regularly: ? Make time to exercise each day. If you do not have time for a long workout, do short bouts of exercise for 5-10 minutes several times a day. ? Do some form of strengthening exercise, like weight lifting, and some form of aerobic exercise, like running or swimming.  Drinking plenty of water or other low-calorie or no-calorie drinks. Drink 6-8 glasses of water daily, or as much as instructed by your health care provider. Summary  Quitting smoking is a physical and mental challenge. You will face cravings, withdrawal symptoms, and temptation to smoke again. Preparation can help you as you go through these challenges.  You can cope with cravings by keeping your mouth busy (such as by chewing gum), keeping your body and hands busy, and making  calls to family, friends, or a helpline for people who want to quit smoking.  You can cope with withdrawal symptoms by avoiding places where people smoke, avoiding drinks with caffeine, and getting plenty of rest.  Ask your health care provider about the different ways to prevent weight gain, avoid stress, and handle social situations. This information is not intended to replace advice given to you by your health care provider. Make sure you discuss any questions you have with your health care provider. Document Revised: 01/24/2017 Document Reviewed: 02/09/2016 Elsevier Patient Education  [place], Adult Feeling a certain amount of stress is normal. Stress helps our body and mind get ready to deal with the demands of life. Stress hormones can motivate you to do well at work and meet your responsibilities. However severe or long-lasting (chronic) stress can affect your mental and physical health. Chronic stress puts you at higher risk for anxiety, depression, and other health problems like digestive problems, muscle aches, heart disease, high blood pressure, and stroke. What are the causes? Common causes of stress include:  Demands from work, such as deadlines, feeling overworked, or having long hours.  Pressures at home, such as money issues, disagreements with a spouse, or parenting issues.  Pressures from major life changes, such as divorce, moving, loss of a loved one, or chronic illness. You may be at higher risk for stress-related problems if you do not get enough sleep, are in poor health, do not have emotional support, or have a mental health disorder like anxiety or depression. How to recognize stress Stress can make you:  Have trouble sleeping.  Feel sad, anxious, irritable, or overwhelmed.  Lose your appetite.  Overeat or want  to eat unhealthy foods.  Want to use drugs or alcohol. Stress can also cause physical symptoms, such as:  Sore, tense  muscles, especially in the shoulders and neck.  Headaches.  Trouble breathing.  A faster heart rate.  Stomach pain, nausea, or vomiting.  Diarrhea or constipation.  Trouble concentrating. Follow these instructions at home: Lifestyle  Identify the source of your stress and your reaction to it. See a therapist who can help you change your reactions.  When there are stressful events: ? Talk about it with family, friends, or co-workers. ? Try to think realistically about stressful events and not ignore them or overreact. ? Try to find the positives in a stressful situation and not focus on the negatives. ? Cut back on responsibilities at work and home, if possible. Ask for help from friends or family members if you need it.  Find ways to cope with stress, such as: ? Meditation. ? Deep breathing. ? Yoga or tai chi. ? Progressive muscle relaxation. ? Doing art, playing music, or reading. ? Making time for fun activities. ? Spending time with family and friends.  Get support from family, friends, or spiritual resources. Eating and drinking  Eat a healthy diet. This includes: ? Eating foods that are high in fiber, such as beans, whole grains, and fresh fruits and vegetables. ? Limiting foods that are high in fat and processed sugars, such as fried and sweet foods.  Do not skip meals or overeat.  Drink enough fluid to keep your urine pale yellow. Alcohol use  Do not drink alcohol if: ? Your health care provider tells you not to drink. ? You are pregnant, may be pregnant, or are planning to become pregnant.  Drinking alcohol is a way some people try to ease their stress. This can be dangerous, so if you drink alcohol: ? Limit how much you use to:  0-1 drink a day for women.  0-2 drinks a day for men. ? Be aware of how much alcohol is in your drink. In the [place] one drink equals one 12 oz bottle of beer (355 mL), one 5 oz glass of wine (148 mL), or one 1 oz glass of hard  liquor (44 mL). Activity   Include 30 minutes of exercise in your daily schedule. Exercise is a good stress reducer.  Include time in your day for an activity that you find relaxing. Try taking a walk, going on a bike ride, reading a book, or listening to music.  Schedule your time in a way that lowers stress, and keep a consistent schedule. Prioritize what is most important to get done. General instructions  Get enough sleep. Try to go to sleep and get up at about the same time every day.  Take over-the-counter and prescription medicines only as told by your health care provider.  Do not use any products that contain nicotine or tobacco, such as cigarettes, e-cigarettes, and chewing tobacco. If you need help quitting, ask your health care provider.  Do not use drugs or smoke to cope with stress.  Keep all follow-up visits as told by your health care provider. This is important. Where to find support  Talk with your health care provider about stress management or finding a support group.  Find a therapist to work with you on your stress management techniques. Contact a health care provider if:  Your stress symptoms get worse.  You are unable to manage your stress at home.  You are struggling to stop  using drugs or alcohol. Get help right away if:  You may be a danger to yourself or others.  You have any thoughts of death or suicide. If you ever feel like you may hurt yourself or others, or have thoughts about taking your own life, get help right away. You can go to your nearest emergency department or call:  Your local emergency services (911 in the [place]  A suicide crisis helpline, such as the [facility] at 216-757-8049. This is open 24 hours a day. Summary  Feeling a certain amount of stress is normal, but severe or long-lasting (chronic) stress can affect your mental and physical health.  Chronic stress can put you at higher risk for  anxiety, depression, and other health problems like digestive problems, muscle aches, heart disease, high blood pressure, and stroke.  You may be at higher risk for stress-related problems if you do not get enough sleep, are in poor health, lack emotional support, or have a mental health disorder like anxiety or depression.  Identify the source of your stress and your reaction to it. Try talking about stressful events with family, friends, or co-workers, finding a coping method, or getting support from spiritual resources.  If you need more help, talk with your health care provider about finding a support group or a mental health therapist. This information is not intended to replace advice given to you by your health care provider. Make sure you discuss any questions you have with your health care provider. Document Revised: 09/09/2018 Document Reviewed: 09/09/2018 Elsevier Patient Education  [place].

## 2019-03-06 LAB — CMP14+EGFR
ALT: 26 IU/L (ref 0–44)
AST: 24 IU/L (ref 0–40)
Albumin/Globulin Ratio: 2.3 — ABNORMAL HIGH (ref 1.2–2.2)
Albumin: 5.1 g/dL (ref 4.1–5.2)
Alkaline Phosphatase: 84 IU/L (ref 39–117)
BUN/Creatinine Ratio: 12 (ref 9–20)
BUN: 14 mg/dL (ref 6–20)
Bilirubin Total: 0.3 mg/dL (ref 0.0–1.2)
CO2: 22 mmol/L (ref 20–29)
Calcium: 9.7 mg/dL (ref 8.7–10.2)
Chloride: 105 mmol/L (ref 96–106)
Creatinine, Ser: 1.13 mg/dL (ref 0.76–1.27)
GFR calc Af Amer: 100 mL/min/{1.73_m2} (ref >59)
GFR calc non Af Amer: 87 mL/min/{1.73_m2} (ref >59)
Globulin, Total: 2.2 g/dL (ref 1.5–4.5)
Glucose: 89 mg/dL (ref 65–99)
Potassium: 5 mmol/L (ref 3.5–5.2)
Sodium: 143 mmol/L (ref 134–144)
Total Protein: 7.3 g/dL (ref 6.0–8.5)

## 2019-03-06 LAB — TSH: TSH: 2 u[IU]/mL (ref 0.450–4.500)

## 2019-03-10 ENCOUNTER — Telehealth: Payer: Self-pay | Admitting: General Practice

## 2019-03-10 NOTE — Telephone Encounter (Signed)
Can you please sign the encounter from 03/05/19 so I can send referral? Thanks

## 2019-03-11 NOTE — Telephone Encounter (Signed)
Please Advise

## 2019-03-15 ENCOUNTER — Telehealth: Payer: Self-pay | Admitting: Adult Health Nurse Practitioner

## 2019-03-15 NOTE — Telephone Encounter (Signed)
Pt needs a refill on anxiety medication. Pharmacy will not fill for him until 03/18/19. Pt states he used the medication as advised by pcp which was to take 1 as needed. The bottle says to take as needed twice daily.

## 2019-03-15 NOTE — Telephone Encounter (Signed)
Pt called back and requested to speak with pcp at [phone]

## 2019-03-17 ENCOUNTER — Ambulatory Visit (INDEPENDENT_AMBULATORY_CARE_PROVIDER_SITE_OTHER): Payer: BC Managed Care – PPO | Admitting: Adult Health Nurse Practitioner

## 2019-03-17 ENCOUNTER — Other Ambulatory Visit: Payer: Self-pay

## 2019-03-17 ENCOUNTER — Encounter: Payer: Self-pay | Admitting: Adult Health Nurse Practitioner

## 2019-03-17 VITALS — BP 130/70 | HR 93 | Temp 98.2°F | Ht 72.0 in | Wt 210.6 lb

## 2019-03-17 DIAGNOSIS — J020 Streptococcal pharyngitis: Secondary | ICD-10-CM

## 2019-03-17 DIAGNOSIS — Z20818 Contact with and (suspected) exposure to other bacterial communicable diseases: Secondary | ICD-10-CM | POA: Diagnosis not present

## 2019-03-17 DIAGNOSIS — Z20828 Contact with and (suspected) exposure to other viral communicable diseases: Secondary | ICD-10-CM | POA: Diagnosis not present

## 2019-03-17 DIAGNOSIS — J029 Acute pharyngitis, unspecified: Secondary | ICD-10-CM | POA: Diagnosis not present

## 2019-03-17 DIAGNOSIS — F4323 Adjustment disorder with mixed anxiety and depressed mood: Secondary | ICD-10-CM

## 2019-03-17 HISTORY — DX: Contact with and (suspected) exposure to other bacterial communicable diseases: Z20.818

## 2019-03-17 LAB — POCT RAPID STREP A (OFFICE): Rapid Strep A Screen: NEGATIVE

## 2019-03-17 MED ORDER — VENLAFAXINE HCL ER 75 MG PO CP24: ORAL_CAPSULE | ORAL | 1 refills | Status: DC

## 2019-03-17 MED ORDER — ALPRAZOLAM 1 MG PO TABS: 1.0000 mg | ORAL_TABLET | Freq: Two times a day (BID) | ORAL | 0 refills | Status: DC | PRN

## 2019-03-17 NOTE — Patient Instructions (Signed)
° ° ° °  If you have lab work done today you will be contacted with your lab results within the next 2 weeks.  If you have not heard from us then please contact us. The fastest way to get your results is to register for My Chart. ° ° °IF you received an x-ray today, you will receive an invoice from [facility]. Please contact [facility] at [phone] with questions or concerns regarding your invoice.  ° °IF you received labwork today, you will receive an invoice from [facility]. Please contact [facility] at [phone] with questions or concerns regarding your invoice.  ° °Our billing staff will not be able to assist you with questions regarding bills from these companies. ° °You will be contacted with the lab results as soon as they are available. The fastest way to get your results is to activate your My Chart account. Instructions are located on the last page of this paperwork. If you have not heard from us regarding the results in 2 weeks, please contact this office. °  ° ° ° °

## 2019-03-19 ENCOUNTER — Encounter: Payer: Self-pay | Admitting: Adult Health Nurse Practitioner

## 2019-03-19 DIAGNOSIS — F4323 Adjustment disorder with mixed anxiety and depressed mood: Secondary | ICD-10-CM

## 2019-03-19 HISTORY — DX: Adjustment disorder with mixed anxiety and depressed mood: F43.23

## 2019-03-19 MED ORDER — AMOXICILLIN 875 MG PO TABS: 875.0000 mg | ORAL_TABLET | Freq: Two times a day (BID) | ORAL | 0 refills | Status: DC

## 2019-03-19 NOTE — Progress Notes (Signed)
Chief Complaint  Patient presents with  . possible strep    Pt nneds to be tested before he is able to return to work   Patient presents with recent exposure to strep.  His daughter tested positive yesterday.  He relates that in the past he has been a carrier of strep day.  His employment would not let him return to work until he has a negative strep test and signed by provider.  Denies any symptoms currently.  He also is following up on his recent visit with me for his anxiety, depression, insomnia.  He is noting that his anxiety continues to be high although the Seroquel is working really great from sleep.  He is also a little bit groggy in the morning.  He feels that he is ready to start an S and RI and his father is on Effexor.  He is amenable to starting.  He remarks that his anxiety was high in the days following our initial consult.  He took Klonopin up to 3 times a day without much relief.  He is wondering whether Xanax would work better.  We discussed that it is short acting and that he should not be taking Klonopin or Xanax that much.  This led into a discussion on controlling his background anxiety and depression. Marland Kitchen  He is noting that his anxiety continues to be high although the Seroquel is working really great from sleep.  He is also a little bit groggy in the morning.  He feels that he is ready to start an SNRI  and his father is on Effexor. This is with the understanding that as the Effexor is titrated and starts becoming effective, there would need to be limited use of his benzodiazepine.  I explained the tolerance and dependence that develops from a benzodiazepine and again reiterated that this is a short-term prescription while his antidepressant/antianxiety medication has time to work.  He verbalized understanding.     Problem List: 2021-01: Exposure to group A Streptococcus 2021-01: Irritability and anger 2021-01: Ectopic cardiac beats 2021-01: Current tobacco use 2021-01:  Stress at home 2021-01: Sleep disorder   Allergies   has No Known Allergies.  Medications    Current Outpatient Medications:  .  clonazePAM (KLONOPIN) 0.5 MG tablet, Take 1 tablet (0.5 mg total) by mouth 2 (two) times daily as needed for anxiety., Disp: 30 tablet, Rfl: 1 .  QUEtiapine (SEROQUEL) 100 MG tablet, 1/2 tab for 5-7 days then increase to 100mg  qhs, Disp: 30 tablet, Rfl: 1 .  ALPRAZolam (XANAX) 1 MG tablet, Take 1 tablet (1 mg total) by mouth 2 (two) times daily as needed for up to 45 doses for anxiety., Disp: 45 tablet, Rfl: 0 .  ibuprofen (ADVIL,MOTRIN) 800 MG tablet, Take 1 tablet (800 mg total) by mouth every 8 (eight) hours as needed for moderate pain. (Patient not taking: Reported on 03/17/2019), Disp: 15 tablet, Rfl: 0 .  venlafaxine XR (EFFEXOR XR) 75 MG 24 hr capsule, 1 tab qday x 1 week, 2 tabs qday after, Disp: 60 capsule, Rfl: 1   Review of Systems    Constitutional: Negative for activity change, appetite change, chills and fever.  HENT: Negative for congestion, nosebleeds, trouble swallowing and voice change.   Respiratory: Negative for cough, shortness of breath and wheezing.   Cardiac:  Negative for chest pain, pressure, syncope  Gastrointestinal: Negative for diarrhea, nausea and vomiting.  Genitourinary: Negative for difficulty urinating, dysuria, flank pain and hematuria.  Musculoskeletal: Negative for  back pain, joint swelling and neck pain.  Neurological: Negative for dizziness, speech difficulty, light-headedness and numbness.  See HPI. All other review of systems negative.     Physical Exam:    height is 6' (1.829 m) and weight is 210 lb 9.6 oz (95.5 kg). His temporal temperature is 98.2 F (36.8 C). His blood pressure is 130/70 and his pulse is 93. His oxygen saturation is 100%.   Physical Examination: General appearance - alert, well appearing, and in no distress and oriented to person, place, and time Mental status - normal mood, behavior, speech,  dress, motor activity, and thought processes Eyes - PERRL. Extraocular movements intact.  No nystagmus.  Neck - supple, no significant adenopathy, carotids upstroke normal bilaterally, no bruits, thyroid exam: thyroid is normal in size without nodules or tenderness Chest - clear to auscultation, no wheezes, rales or rhonchi, symmetric air entry  Heart - normal rate, regular rhythm, normal S1, S2, no murmurs, rubs, clicks or gallops Extremities - dependent LE edema without clubbing or cyanosis Skin - normal coloration and turgor, no rashes, no suspicious skin lesions noted  No hyperpigmentation of skin.  No current hematomas noted   Lab /Imaging Review   Labs from last visit with patient were reviewed in person.  There were no abnormalities.  Assessment & Plan:  Mark Holt is a 29 y.o. male    1. Strep throat   2. Sore throat   3. Exposure to group A Streptococcus    Orders Placed This Encounter  Procedures  . POCT rapid strep A   Meds ordered this encounter  Medications  . ALPRAZolam (XANAX) 1 MG tablet    Sig: Take 1 tablet (1 mg total) by mouth 2 (two) times daily as needed for up to 45 doses for anxiety.    Dispense:  45 tablet    Refill:  0  . venlafaxine XR (EFFEXOR XR) 75 MG 24 hr capsule    Sig: 1 tab qday x 1 week, 2 tabs qday after    Dispense:  60 capsule    Refill:  1    I reiterated to the patient the short-term nature of his Xanax prescription and that this has to last the entire month.  We discussed Effexor.  He will take 75 mg initially once a day for a week and then move up to the 150 mg.  We discussed side effects but also the fact that these medications can take a little while to start working.  Will follow up regarding the new medications at his next scheduled appointment.  I spent approximately 45 minutes of  face-to-face with the patient during this encounter and over half of that time was spent on counseling and coordination of care.   Elyse Jarvis, NP

## 2019-04-01 ENCOUNTER — Ambulatory Visit (INDEPENDENT_AMBULATORY_CARE_PROVIDER_SITE_OTHER): Payer: BC Managed Care – PPO | Admitting: Adult Health Nurse Practitioner

## 2019-04-01 ENCOUNTER — Ambulatory Visit: Payer: BC Managed Care – PPO | Admitting: Adult Health Nurse Practitioner

## 2019-04-01 ENCOUNTER — Other Ambulatory Visit: Payer: Self-pay

## 2019-04-01 VITALS — BP 129/79 | HR 78 | Temp 98.0°F | Ht 72.0 in | Wt 210.0 lb

## 2019-04-01 DIAGNOSIS — Z720 Tobacco use: Secondary | ICD-10-CM

## 2019-04-01 DIAGNOSIS — F5221 Male erectile disorder: Secondary | ICD-10-CM | POA: Diagnosis not present

## 2019-04-01 DIAGNOSIS — F4323 Adjustment disorder with mixed anxiety and depressed mood: Secondary | ICD-10-CM | POA: Diagnosis not present

## 2019-04-01 DIAGNOSIS — R454 Irritability and anger: Secondary | ICD-10-CM | POA: Diagnosis not present

## 2019-04-01 DIAGNOSIS — G479 Sleep disorder, unspecified: Secondary | ICD-10-CM

## 2019-04-01 DIAGNOSIS — F439 Reaction to severe stress, unspecified: Secondary | ICD-10-CM

## 2019-04-01 MED ORDER — VENLAFAXINE HCL ER 150 MG PO CP24: 150.0000 mg | ORAL_CAPSULE | Freq: Every day | ORAL | 5 refills | Status: DC

## 2019-04-01 MED ORDER — TADALAFIL 20 MG PO TABS: 20.0000 mg | ORAL_TABLET | Freq: Every day | ORAL | 0 refills | Status: DC | PRN

## 2019-04-01 NOTE — Patient Instructions (Signed)
° ° ° °  If you have lab work done today you will be contacted with your lab results within the next 2 weeks.  If you have not heard from us then please contact us. The fastest way to get your results is to register for My Chart. ° ° °IF you received an x-ray today, you will receive an invoice from [facility]. Please contact [facility] at [phone] with questions or concerns regarding your invoice.  ° °IF you received labwork today, you will receive an invoice from [facility]. Please contact [facility] at [phone] with questions or concerns regarding your invoice.  ° °Our billing staff will not be able to assist you with questions regarding bills from these companies. ° °You will be contacted with the lab results as soon as they are available. The fastest way to get your results is to activate your My Chart account. Instructions are located on the last page of this paperwork. If you have not heard from us regarding the results in 2 weeks, please contact this office. °  ° ° ° °

## 2019-04-01 NOTE — Progress Notes (Signed)
+++  Subjective:     Mark Holt is a 29 y.o. male who presents for follow up of anxiety disorder. Current symptoms: fatigue. He denies current suicidal and homicidal ideation. He complains of the following side effects from the treatment: sexual dysfunction.  The following portions of the patient's history were reviewed and updated as appropriate: allergies, current medications, past family history, past medical history, past social history, past surgical history and problem list.    Objective:    BP 129/79 (BP Location: Left Arm, Patient Position: Sitting, Cuff Size: Normal)   Pulse 78   Temp 98 F (36.7 C) (Temporal)   Ht 6' (1.829 m)   Wt 210 lb (95.3 kg)   SpO2 98%   BMI 28.48 kg/m   General:  alert, cooperative and no distress  Affect/Behavior:  full facial expressions, normal perception, normal reasoning, normal speech pattern and content and normal thought patterns none      Assessment:    Anxiety Disorder - stable, improving    Plan:    Medications: Effexor and Valium. Follow up: 6 weeks. Spent 25 minutes (>50% of visit) discussing the risks of anxiety disorder, the  pathophysiology, etiology, risks, and principles of treatment.    Elyse Jarvis, NP

## 2019-04-02 ENCOUNTER — Ambulatory Visit: Payer: BC Managed Care – PPO | Admitting: Adult Health Nurse Practitioner

## 2019-04-07 ENCOUNTER — Ambulatory Visit: Payer: BC Managed Care – PPO | Admitting: Adult Health Nurse Practitioner

## 2019-04-08 ENCOUNTER — Encounter: Payer: Self-pay | Admitting: Adult Health Nurse Practitioner

## 2019-04-19 ENCOUNTER — Telehealth: Payer: Self-pay | Admitting: Adult Health Nurse Practitioner

## 2019-04-19 NOTE — Telephone Encounter (Signed)
See refill request for refill of Xanax below.

## 2019-04-19 NOTE — Telephone Encounter (Signed)
Medication: ALPRAZolam (XANAX) 1 MG tablet [[id number]]   Has the patient contacted their pharmacy? Yes  (Agent: If no, request that the patient contact the pharmacy for the refill.) (Agent: If yes, when and what did the pharmacy advise?)  Preferred Pharmacy (with phone number or street name): [id number] [place], [place] - [address] AT [facility]  Phone:  [phone] Fax:  [phone]     Agent: Please be advised that RX refills may take up to 3 business days. We ask that you follow-up with your pharmacy.

## 2019-04-20 NOTE — Telephone Encounter (Signed)
Please Advise

## 2019-04-22 ENCOUNTER — Other Ambulatory Visit: Payer: Self-pay

## 2019-04-22 ENCOUNTER — Telehealth (INDEPENDENT_AMBULATORY_CARE_PROVIDER_SITE_OTHER): Payer: BC Managed Care – PPO | Admitting: Adult Health Nurse Practitioner

## 2019-04-22 DIAGNOSIS — F411 Generalized anxiety disorder: Secondary | ICD-10-CM | POA: Diagnosis not present

## 2019-04-22 MED ORDER — BUSPIRONE HCL 5 MG PO TABS: ORAL_TABLET | ORAL | 0 refills | Status: DC

## 2019-04-22 MED ORDER — ALPRAZOLAM 1 MG PO TABS: 1.0000 mg | ORAL_TABLET | Freq: Two times a day (BID) | ORAL | 3 refills | Status: DC | PRN

## 2019-04-22 NOTE — Patient Instructions (Signed)
° ° ° °  If you have lab work done today you will be contacted with your lab results within the next 2 weeks.  If you have not heard from us then please contact us. The fastest way to get your results is to register for My Chart. ° ° °IF you received an x-ray today, you will receive an invoice from [facility]. Please contact [facility] at [phone] with questions or concerns regarding your invoice.  ° °IF you received labwork today, you will receive an invoice from [facility]. Please contact [facility] at [phone] with questions or concerns regarding your invoice.  ° °Our billing staff will not be able to assist you with questions regarding bills from these companies. ° °You will be contacted with the lab results as soon as they are available. The fastest way to get your results is to activate your My Chart account. Instructions are located on the last page of this paperwork. If you have not heard from us regarding the results in 2 weeks, please contact this office. °  ° ° ° °

## 2019-05-04 ENCOUNTER — Encounter: Payer: Self-pay | Admitting: Adult Health Nurse Practitioner

## 2019-05-04 NOTE — Progress Notes (Signed)
Virtual Visit via Video Note  I connected with Mark Holt on 05/04/19 at 10:10 AM EST by a video enabled telemedicine application and verified that I am speaking with the correct person using two identifiers.  Location: Patient: home  Provider: [facility]    I discussed the limitations of evaluation and management by telemedicine and the availability of in person appointments. The patient expressed understanding and agreed to proceed.  History of Present Illness:  Patient presents for follow-up on his anxiety and anger.  He is doing very well, managing his anger better at home.  He is still having a lot of anxiety and the alprazolam works for that.  I have cautioned him against using too much of this as it is very easy to become dependent.  He denies any feelings of hurting himself or others.  No depression currently.  Stable on other medications.    Review of Systems See HPI Constitution: No fevers or chills No malaise No diaphoresis Skin: No rash or itching Eyes: no blurry vision, no double vision GU: no dysuria or hematuria Neuro: no dizziness or headaches  Observations/Objective: General appearance: alert, well appearing, and in no distress. Mental Status: normal mood, behavior, speech, dress, motor activity, and thought processes.   Assessment and Plan:  1. Generalized anxiety disorder    Meds ordered this encounter  Medications  . ALPRAZolam (XANAX) 1 MG tablet    Sig: Take 1 tablet (1 mg total) by mouth 2 (two) times daily as needed for up to 45 doses for anxiety.    Dispense:  45 tablet    Refill:  3  . busPIRone (BUSPAR) 5 MG tablet    Sig: 5mg  qday for 3 days, then increase to 10mg  daily, If stable after 2 weeks of 10mg  daily, may increase to 15mg  total daily    Dispense:  90 tablet    Refill:  0     Follow Up Instructions:    I discussed the assessment and treatment plan with the patient. The patient was provided an opportunity to ask  questions and all were answered. The patient agreed with the plan and demonstrated an understanding of the instructions.   The patient was advised to call back or seek an in-person evaluation if the symptoms worsen or if the condition fails to improve as anticipated.  I provided 18 minutes of non-face-to-face time during this encounter.   , NP

## 2019-06-23 ENCOUNTER — Other Ambulatory Visit: Payer: Self-pay | Admitting: Adult Health Nurse Practitioner

## 2019-06-23 NOTE — Telephone Encounter (Signed)
See message below °

## 2019-06-23 NOTE — Telephone Encounter (Signed)
Patient is being told by his pharmacy that the med below needs prior Auth / What is the name of the medication? ALPRAZolam Prudy Feeler) 1 MG tablet [   Have you contacted your pharmacy to request a refill? y  Which pharmacy would you like this sent to? [id number] [place], [place] - [address] AT [facility]  [address], [place] [address]  Phone:  [phone] Fax:  [phone]  [facility]: Lambert Rhonda W #:  [id number]    Patient notified that their request is being sent to the clinical staff for review and that they should receive a call once it is complete. If they do not receive a call within 72 hours they can check with their pharmacy or our office.

## 2019-06-23 NOTE — Telephone Encounter (Signed)
Patient is requesting a refill of the following medications: Requested Prescriptions   Pending Prescriptions Disp Refills  . ALPRAZolam (XANAX) 1 MG tablet 45 tablet 3    Sig: Take 1 tablet (1 mg total) by mouth 2 (two) times daily as needed for up to 45 doses for anxiety.    Date of patient request: 06/23/2019 Last office visit: 04/22/2019 Date of last refill:04/22/2019  Last refill amount: 45 tablet Follow up time period per chart: as needed

## 2019-06-28 NOTE — Telephone Encounter (Signed)
[facility]28

## 2019-07-05 DIAGNOSIS — Z03818 Encounter for observation for suspected exposure to other biological agents ruled out: Secondary | ICD-10-CM | POA: Diagnosis not present

## 2019-07-05 DIAGNOSIS — Z20828 Contact with and (suspected) exposure to other viral communicable diseases: Secondary | ICD-10-CM | POA: Diagnosis not present

## 2019-08-13 ENCOUNTER — Other Ambulatory Visit: Payer: Self-pay

## 2019-08-13 DIAGNOSIS — F411 Generalized anxiety disorder: Secondary | ICD-10-CM

## 2019-08-13 MED ORDER — VENLAFAXINE HCL ER 150 MG PO CP24: 150.0000 mg | ORAL_CAPSULE | Freq: Every day | ORAL | 1 refills | Status: DC

## 2019-08-20 ENCOUNTER — Encounter: Payer: Self-pay | Admitting: Registered Nurse

## 2019-08-20 ENCOUNTER — Other Ambulatory Visit: Payer: Self-pay

## 2019-08-20 ENCOUNTER — Telehealth (INDEPENDENT_AMBULATORY_CARE_PROVIDER_SITE_OTHER): Payer: BC Managed Care – PPO | Admitting: Registered Nurse

## 2019-08-20 DIAGNOSIS — F411 Generalized anxiety disorder: Secondary | ICD-10-CM

## 2019-08-20 MED ORDER — QUETIAPINE FUMARATE 100 MG PO TABS: 100.0000 mg | ORAL_TABLET | Freq: Every day | ORAL | 1 refills | Status: DC

## 2019-08-20 MED ORDER — ALPRAZOLAM 1 MG PO TABS: 1.0000 mg | ORAL_TABLET | Freq: Two times a day (BID) | ORAL | 0 refills | Status: DC | PRN

## 2019-08-20 MED ORDER — VENLAFAXINE HCL ER 150 MG PO CP24: 150.0000 mg | ORAL_CAPSULE | Freq: Every day | ORAL | 1 refills | Status: DC

## 2019-08-20 NOTE — Progress Notes (Signed)
Telemedicine Encounter- SOAP NOTE Established Patient  This telephone encounter was conducted with the patient's (or proxy's) verbal consent via audio telecommunications: yes  Patient was instructed to have this encounter in a suitably private space; and to only have persons present to whom they give permission to participate. In addition, patient identity was confirmed by use of name plus two identifiers (DOB and address).  I discussed the limitations, risks, security and privacy concerns of performing an evaluation and management service by telephone and the availability of in person appointments. I also discussed with the patient that there may be a patient responsible charge related to this service. The patient expressed understanding and agreed to proceed.  I spent a total of 12 minutes talking with the patient or their proxy.  Chief Complaint  Patient presents with  . Medication Refill    Patient states he is an old patient of [name] and need medication refills as soon as possible. Per patient he has a TOC in august so it was best to do a Telemed     Subjective   Mark Holt is a 29 y.o. established patient. Telephone visit today for med refills  HPI Past pt of [facility]. Was scheduled for TOC in August with myself, but will run out of meds before that time.  Needs fills on: Alprazolam 1mg  PO bid PRN: taking for breakthrough anxiety and emotional lability. Some days takes none, some once, some twice, but on average 1-2 times each day. Has received 22 day supplies and gone through these in about 30 days with some consistency. He understands that this is not an ideal long term medication.  Seroquel: taking 100mg  PO qhs for sleep aid. Working well. No AEs. May be helping with emotional lability as well.  Venlafaxine 150mg  ER24 PO qd: has helped a lot with emotional lability. Good effect. No AEs. Hoping to continue. Hoping for further effect and positive impact going  forward.  Otherwise feels well.   Patient Active Problem List   Diagnosis Date Noted  . Erectile dysfunction of non-organic origin 04/01/2019  . Situational mixed anxiety and depressive disorder 03/19/2019  . Exposure to group A Streptococcus 03/17/2019  . Irritability and anger 03/05/2019  . Ectopic cardiac beats 03/05/2019  . Current tobacco use 03/05/2019  . Stress at home 03/05/2019  . Sleep disorder 03/05/2019    Past Medical History:  Diagnosis Date  . Ectopic cardiac beats 03/05/2019  . Exposure to group A Streptococcus 03/17/2019  . Irritability and anger 03/05/2019  . Situational mixed anxiety and depressive disorder 03/19/2019  . Sleep disorder 03/05/2019  . Stress at home 03/05/2019  . Tobacco use disorder 03/05/2019    Current Outpatient Medications  Medication Sig Dispense Refill  . ALPRAZolam (XANAX) 1 MG tablet Take 1 tablet (1 mg total) by mouth 2 (two) times daily as needed for anxiety. 60 tablet 0  . busPIRone (BUSPAR) 5 MG tablet 5mg  qday for 3 days, then increase to 10mg  daily, If stable after 2 weeks of 10mg  daily, may increase to 15mg  total daily 90 tablet 0  . QUEtiapine (SEROQUEL) 100 MG tablet Take 1 tablet (100 mg total) by mouth at bedtime. 90 tablet 1  . tadalafil (CIALIS) 20 MG tablet Take 1 tablet (20 mg total) by mouth daily as needed for erectile dysfunction. 30 tablet 0  . venlafaxine XR (EFFEXOR XR) 150 MG 24 hr capsule Take 1 capsule (150 mg total) by mouth daily with breakfast. 90 capsule 1  No current facility-administered medications for this visit.    No Known Allergies  Social History   Socioeconomic History  . Marital status: Married    Spouse name: Not on file  . Number of children: Not on file  . Years of education: Not on file  . Highest education level: Not on file  Occupational History  . Not on file  Tobacco Use  . Smoking status: Current Every Day Smoker    Packs/day: 0.50    Types: Cigarettes  . Smokeless tobacco: Never Used   Substance and Sexual Activity  . Alcohol use: Yes  . Drug use: No  . Sexual activity: Not on file  Other Topics Concern  . Not on file  Social History Narrative  . Not on file   Social Determinants of Health   Financial Resource Strain:   . Difficulty of Paying Living Expenses:   Food Insecurity:   . Worried About Programme researcher, broadcasting/film/video in the Last Year:   . Barista in the Last Year:   Transportation Needs:   . Freight forwarder (Medical):   Marland Kitchen Lack of Transportation (Non-Medical):   Physical Activity:   . Days of Exercise per Week:   . Minutes of Exercise per Session:   Stress:   . Feeling of Stress :   Social Connections:   . Frequency of Communication with Friends and Family:   . Frequency of Social Gatherings with Friends and Family:   . Attends Religious Services:   . Active Member of Clubs or Organizations:   . Attends Banker Meetings:   Marland Kitchen Marital Status:   Intimate Partner Violence:   . Fear of Current or Ex-Partner:   . Emotionally Abused:   Marland Kitchen Physically Abused:   . Sexually Abused:     Review of Systems  Constitutional: Negative.   HENT: Negative.   Eyes: Negative.   Respiratory: Negative.   Cardiovascular: Negative.   Gastrointestinal: Negative.   Genitourinary: Negative.   Musculoskeletal: Negative.   Skin: Negative.   Neurological: Negative.   Endo/Heme/Allergies: Negative.   Psychiatric/Behavioral: Positive for depression. Negative for hallucinations, memory loss, substance abuse and suicidal ideas. The patient is nervous/anxious. The patient does not have insomnia.   All other systems reviewed and are negative.   Objective   Vitals as reported by the patient: There were no vitals filed for this visit.  Mark Holt was seen today for medication refill.  Diagnoses and all orders for this visit:  Generalized anxiety disorder -     ALPRAZolam (XANAX) 1 MG tablet; Take 1 tablet (1 mg total) by mouth 2 (two) times daily as  needed for anxiety. -     venlafaxine XR (EFFEXOR XR) 150 MG 24 hr capsule; Take 1 capsule (150 mg total) by mouth daily with breakfast. -     QUEtiapine (SEROQUEL) 100 MG tablet; Take 1 tablet (100 mg total) by mouth at bedtime.   PLAN  Reorders as above.  Return in 2-3 mos for med check - may titrate up seroquel and lessen alprazolam at that time. May add in another PRN to take alprazolam's place  Patient encouraged to call clinic with any questions, comments, or concerns.   I discussed the assessment and treatment plan with the patient. The patient was provided an opportunity to ask questions and all were answered. The patient agreed with the plan and demonstrated an understanding of the instructions.   The patient was advised to call back or  seek an in-person evaluation if the symptoms worsen or if the condition fails to improve as anticipated.  I provided 13 minutes of non-face-to-face time during this encounter.  Maximiano Coss, NP  Primary Care at [facility]

## 2019-08-20 NOTE — Patient Instructions (Signed)
° ° ° °  If you have lab work done today you will be contacted with your lab results within the next 2 weeks.  If you have not heard from us then please contact us. The fastest way to get your results is to register for My Chart. ° ° °IF you received an x-ray today, you will receive an invoice from [facility]. Please contact [facility] at [phone] with questions or concerns regarding your invoice.  ° °IF you received labwork today, you will receive an invoice from [facility]. Please contact [facility] at [phone] with questions or concerns regarding your invoice.  ° °Our billing staff will not be able to assist you with questions regarding bills from these companies. ° °You will be contacted with the lab results as soon as they are available. The fastest way to get your results is to activate your My Chart account. Instructions are located on the last page of this paperwork. If you have not heard from us regarding the results in 2 weeks, please contact this office. °  ° ° ° °

## 2019-09-20 ENCOUNTER — Other Ambulatory Visit: Payer: Self-pay | Admitting: Registered Nurse

## 2019-09-20 DIAGNOSIS — F411 Generalized anxiety disorder: Secondary | ICD-10-CM

## 2019-09-20 NOTE — Telephone Encounter (Signed)
Patient is requesting a refill of the following medications: Requested Prescriptions   Pending Prescriptions Disp Refills  . ALPRAZolam (XANAX) 1 MG tablet [Pharmacy Med Name: ALPRAZOLAM 1 MG TABLET] 60 tablet 0    Sig: TAKE 1 TABLET (1 MG TOTAL) BY MOUTH 2 (TWO) TIMES DAILY AS NEEDED FOR ANXIETY.    Date of patient request: 09/20/19 Last office visit: 08/20/19 Date of last refill: 08/20/19 Last refill amount: 60 +0  Follow up time period per chart: 10/01/19  I did not see it on active meds which is a bit odd.

## 2019-10-01 ENCOUNTER — Ambulatory Visit (INDEPENDENT_AMBULATORY_CARE_PROVIDER_SITE_OTHER): Payer: BC Managed Care – PPO | Admitting: Registered Nurse

## 2019-10-01 ENCOUNTER — Other Ambulatory Visit: Payer: Self-pay

## 2019-10-01 ENCOUNTER — Encounter: Payer: Self-pay | Admitting: Registered Nurse

## 2019-10-01 VITALS — BP 121/69 | HR 81 | Temp 98.4°F | Ht 72.0 in | Wt 205.0 lb

## 2019-10-01 DIAGNOSIS — F411 Generalized anxiety disorder: Secondary | ICD-10-CM

## 2019-10-01 DIAGNOSIS — F5221 Male erectile disorder: Secondary | ICD-10-CM

## 2019-10-01 MED ORDER — TADALAFIL 20 MG PO TABS: 20.0000 mg | ORAL_TABLET | Freq: Every day | ORAL | 0 refills | Status: DC | PRN

## 2019-10-01 MED ORDER — BUSPIRONE HCL 5 MG PO TABS: 5.0000 mg | ORAL_TABLET | Freq: Two times a day (BID) | ORAL | 0 refills | Status: DC

## 2019-10-01 NOTE — Patient Instructions (Signed)
° ° ° °  If you have lab work done today you will be contacted with your lab results within the next 2 weeks.  If you have not heard from us then please contact us. The fastest way to get your results is to register for My Chart. ° ° °IF you received an x-ray today, you will receive an invoice from [facility]. Please contact [facility] at [phone] with questions or concerns regarding your invoice.  ° °IF you received labwork today, you will receive an invoice from [facility]. Please contact [facility] at [phone] with questions or concerns regarding your invoice.  ° °Our billing staff will not be able to assist you with questions regarding bills from these companies. ° °You will be contacted with the lab results as soon as they are available. The fastest way to get your results is to activate your My Chart account. Instructions are located on the last page of this paperwork. If you have not heard from us regarding the results in 2 weeks, please contact this office. °  ° ° ° °

## 2019-10-20 ENCOUNTER — Other Ambulatory Visit: Payer: Self-pay | Admitting: Registered Nurse

## 2019-10-20 DIAGNOSIS — F411 Generalized anxiety disorder: Secondary | ICD-10-CM

## 2019-10-20 NOTE — Telephone Encounter (Signed)
Patient is requesting a refill of the following medications: Requested Prescriptions   Pending Prescriptions Disp Refills   ALPRAZolam (XANAX) 1 MG tablet [Pharmacy Med Name: ALPRAZOLAM 1 MG TABLET] 60 tablet 0    Sig: TAKE 1 TABLET (1 MG TOTAL) BY MOUTH 2 (TWO) TIMES DAILY AS NEEDED FOR ANXIETY.    Date of patient request: 10/20/19 Last office visit: 10/01/19 Date of last refill: 09/14/19 Last refill amount:60 Follow up time period per chart: 01/07/20

## 2019-11-23 ENCOUNTER — Encounter: Payer: Self-pay | Admitting: Registered Nurse
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
## 2019-11-24 ENCOUNTER — Other Ambulatory Visit: Payer: Self-pay | Admitting: Registered Nurse

## 2019-11-24 DIAGNOSIS — F411 Generalized anxiety disorder: Secondary | ICD-10-CM

## 2019-11-24 NOTE — Telephone Encounter (Signed)
Requested medication (s) are due for refill today: no  Requested medication (s) are on the active medication list: yes  Last refill:  10/20/2019  Future visit scheduled: yes  Notes to clinic: this refill cannot be delegated    Requested Prescriptions  Pending Prescriptions Disp Refills   ALPRAZolam (XANAX) 1 MG tablet [Pharmacy Med Name: ALPRAZOLAM 1 MG TABLET] 60 tablet 0    Sig: TAKE 1 TABLET (1 MG TOTAL) BY MOUTH 2 (TWO) TIMES DAILY AS NEEDED FOR ANXIETY.      Not Delegated - Psychiatry:  Anxiolytics/Hypnotics Failed - 11/24/2019  6:08 PM      Failed - This refill cannot be delegated      Failed - Urine Drug Screen completed in last 360 days.      Passed - Valid encounter within last 6 months    Recent Outpatient Visits           1 month ago Generalized anxiety disorder   [facility], Gerlene Burdock, NP   3 months ago Generalized anxiety disorder   [facility], Gerlene Burdock, NP   7 months ago Generalized anxiety disorder   [facility] at [facility], Lonna Cobb, NP   7 months ago Situational mixed anxiety and depressive disorder   [facility] at [facility], Lonna Cobb, NP   8 months ago Strep throat   Primary Care at [facility], Lonna Cobb, NP       Future Appointments             In 1 month Janeece Agee, NP Primary Care at [place], [facility]

## 2019-11-25 ENCOUNTER — Telehealth: Payer: Self-pay | Admitting: Registered Nurse

## 2019-11-25 ENCOUNTER — Other Ambulatory Visit: Payer: Self-pay | Admitting: Registered Nurse

## 2019-11-25 DIAGNOSIS — F411 Generalized anxiety disorder: Secondary | ICD-10-CM

## 2019-11-25 NOTE — Telephone Encounter (Signed)
Patient is requesting a refill of the following medications: Requested Prescriptions   Pending Prescriptions Disp Refills   ALPRAZolam (XANAX) 1 MG tablet [Pharmacy Med Name: ALPRAZOLAM 1 MG TABLET] 60 tablet 0    Sig: TAKE 1 TABLET (1 MG TOTAL) BY MOUTH 2 (TWO) TIMES DAILY AS NEEDED FOR ANXIETY.    Date of patient request: 11/24/19 Last office visit: 10/01/19 Date of last refill: 10/20/19 Last refill amount: 60 Follow up time period per chart:

## 2019-11-25 NOTE — Telephone Encounter (Signed)
Pt would like to see if he can get this medication 3 month at a time. Pt stated his pharmacy take a week to refill this medication. I told pt because of the type of medication that might be why because they need to send in a PA.  ALPRAZolam (XANAX) 1 MG tablet [[id number]]  Please advise.

## 2019-11-25 NOTE — Telephone Encounter (Signed)
Please Advise

## 2019-11-25 NOTE — Telephone Encounter (Signed)
For controls like alprazolam I only dispense 1 mo at a time for nearly all patients  Thank you  Jari Sportsman, NP

## 2019-11-26 NOTE — Telephone Encounter (Signed)
Patient is requesting a refill of the following medications: Requested Prescriptions   Pending Prescriptions Disp Refills   QUEtiapine (SEROQUEL) 100 MG tablet [Pharmacy Med Name: QUETIAPINE FUMARATE 100 MG TAB] 90 tablet 1    Sig: TAKE 1 TABLET BY MOUTH EVERYDAY AT BEDTIME    Date of patient request: 11/26/2019 Last office visit: 10/01/2019 Date of last refill: 08/20/2019 Last refill amount: 90 tab Follow up time period per chart: 4-6 weeks

## 2019-12-23 ENCOUNTER — Other Ambulatory Visit: Payer: Self-pay | Admitting: Registered Nurse

## 2019-12-23 DIAGNOSIS — F411 Generalized anxiety disorder: Secondary | ICD-10-CM

## 2019-12-23 NOTE — Telephone Encounter (Signed)
Requested medication (s) are due for refill today: yes  Requested medication (s) are on the active medication list: yes   Last refill:  11/25/2019  Future visit scheduled: yes  Notes to clinic:  this refill cannot be delegated    Requested Prescriptions  Pending Prescriptions Disp Refills   ALPRAZolam (XANAX) 1 MG tablet [Pharmacy Med Name: ALPRAZOLAM 1 MG TABLET] 60 tablet 0    Sig: TAKE 1 TABLET (1 MG TOTAL) BY MOUTH 2 (TWO) TIMES DAILY AS NEEDED FOR ANXIETY.      Not Delegated - Psychiatry:  Anxiolytics/Hypnotics Failed - 12/23/2019  1:10 PM      Failed - This refill cannot be delegated      Failed - Urine Drug Screen completed in last 360 days.      Passed - Valid encounter within last 6 months    Recent Outpatient Visits           2 months ago Generalized anxiety disorder   [facility] at [facility], Gerlene Burdock, NP   4 months ago Generalized anxiety disorder   [facility] at [facility], Gerlene Burdock, NP   8 months ago Generalized anxiety disorder   Primary Care at [facility], Lonna Cobb, NP   8 months ago Situational mixed anxiety and depressive disorder   Primary Care at [facility], Lonna Cobb, NP   9 months ago Strep throat   Primary Care at [facility], Lonna Cobb, NP       Future Appointments             In 2 weeks Janeece Agee, NP Primary Care at [place], [facility]

## 2019-12-23 NOTE — Telephone Encounter (Signed)
Patient is requesting a refill of the following medications: Requested Prescriptions   Pending Prescriptions Disp Refills   ALPRAZolam (XANAX) 1 MG tablet [Pharmacy Med Name: ALPRAZOLAM 1 MG TABLET] 60 tablet 0    Sig: TAKE 1 TABLET (1 MG TOTAL) BY MOUTH 2 (TWO) TIMES DAILY AS NEEDED FOR ANXIETY.    Date of patient request: 12/23/2019 Last office visit: 10/01/2019 Date of last refill: 11/25/2019 Last refill amount: 60 Follow up time period per chart: 4-6 weeks

## 2019-12-29 DIAGNOSIS — Z20822 Contact with and (suspected) exposure to covid-19: Secondary | ICD-10-CM | POA: Diagnosis not present

## 2020-01-07 ENCOUNTER — Ambulatory Visit: Payer: BC Managed Care – PPO | Admitting: Registered Nurse

## 2020-01-12 ENCOUNTER — Ambulatory Visit (INDEPENDENT_AMBULATORY_CARE_PROVIDER_SITE_OTHER): Payer: BC Managed Care – PPO | Admitting: Registered Nurse

## 2020-01-12 ENCOUNTER — Other Ambulatory Visit: Payer: Self-pay

## 2020-01-12 ENCOUNTER — Encounter: Payer: Self-pay | Admitting: Registered Nurse

## 2020-01-12 VITALS — BP 135/78 | HR 82 | Temp 97.9°F | Resp 18 | Ht 72.0 in | Wt 198.8 lb

## 2020-01-12 DIAGNOSIS — F439 Reaction to severe stress, unspecified: Secondary | ICD-10-CM | POA: Diagnosis not present

## 2020-01-12 DIAGNOSIS — G479 Sleep disorder, unspecified: Secondary | ICD-10-CM | POA: Diagnosis not present

## 2020-01-12 DIAGNOSIS — F4323 Adjustment disorder with mixed anxiety and depressed mood: Secondary | ICD-10-CM

## 2020-01-12 NOTE — Patient Instructions (Signed)
° ° ° °  If you have lab work done today you will be contacted with your lab results within the next 2 weeks.  If you have not heard from us then please contact us. The fastest way to get your results is to register for My Chart. ° ° °IF you received an x-ray today, you will receive an invoice from [facility]. Please contact [facility] at [phone] with questions or concerns regarding your invoice.  ° °IF you received labwork today, you will receive an invoice from [facility]. Please contact [facility] at [phone] with questions or concerns regarding your invoice.  ° °Our billing staff will not be able to assist you with questions regarding bills from these companies. ° °You will be contacted with the lab results as soon as they are available. The fastest way to get your results is to activate your My Chart account. Instructions are located on the last page of this paperwork. If you have not heard from us regarding the results in 2 weeks, please contact this office. °  ° ° ° °

## 2020-01-26 ENCOUNTER — Telehealth: Payer: Self-pay | Admitting: Registered Nurse

## 2020-01-26 ENCOUNTER — Other Ambulatory Visit: Payer: Self-pay | Admitting: Registered Nurse

## 2020-01-26 DIAGNOSIS — R454 Irritability and anger: Secondary | ICD-10-CM

## 2020-01-26 DIAGNOSIS — F411 Generalized anxiety disorder: Secondary | ICD-10-CM

## 2020-01-26 MED ORDER — ALPRAZOLAM 0.5 MG PO TBDP: 0.5000 mg | ORAL_TABLET | Freq: Two times a day (BID) | ORAL | 0 refills | Status: DC | PRN

## 2020-01-26 NOTE — Telephone Encounter (Signed)
Was the pt taken off of Xanax purposefully?   According to the last refill.  "11/25/19 End Date: 12/24/19 Discontinued by: Janeece Agee, NP on 12/24/2019 08:09  Please advise if a refill is appropriate.

## 2020-01-26 NOTE — Telephone Encounter (Signed)
Has been sent  Thanks  Rich

## 2020-01-26 NOTE — Telephone Encounter (Signed)
Patient had appointment mid November. Provider was supposed to call in a 3 month prescription for Xanax. Has to go through long process to get meds filled which takes 1 week. Medication not listed on MyChart any more and refill never called in. Ran out of meds yesterday.  [facility]  Phone number is [phone]  Please advise at [phone]

## 2020-01-26 NOTE — Telephone Encounter (Signed)
I have called the pharmacy and informed him that the RX has been sent in

## 2020-01-28 NOTE — Telephone Encounter (Signed)
Pt medication isnt covered at the [facility] covered as Xanax if you could re prescribe original

## 2020-01-28 NOTE — Telephone Encounter (Signed)
Patient states that medication type, dose, quantity, and price were changed and he can't afford to pay the difference (3 times more expensive). Please advise at [phone]

## 2020-01-31 NOTE — Telephone Encounter (Signed)
Pt checking on status of this medication. The new script is more than he can afford.

## 2020-02-03 ENCOUNTER — Other Ambulatory Visit: Payer: Self-pay | Admitting: Registered Nurse

## 2020-02-03 DIAGNOSIS — F411 Generalized anxiety disorder: Secondary | ICD-10-CM

## 2020-02-03 DIAGNOSIS — R454 Irritability and anger: Secondary | ICD-10-CM

## 2020-02-03 MED ORDER — ALPRAZOLAM 0.5 MG PO TABS: 0.5000 mg | ORAL_TABLET | Freq: Two times a day (BID) | ORAL | 0 refills | Status: DC | PRN

## 2020-03-17 ENCOUNTER — Other Ambulatory Visit: Payer: Self-pay | Admitting: Registered Nurse

## 2020-03-17 DIAGNOSIS — R454 Irritability and anger: Secondary | ICD-10-CM

## 2020-03-17 DIAGNOSIS — F411 Generalized anxiety disorder: Secondary | ICD-10-CM

## 2020-03-17 NOTE — Telephone Encounter (Signed)
Pt reports that  he is supposed to get the ALPRAZolam (XANAX) 1 MG tablet  BID vs the current script. Current script says .5mg  of Xanax for BID. Please advise on which one he was supposed to get. I have pended both please send in the correct one if applicable.

## 2020-03-19 MED ORDER — ALPRAZOLAM 1 MG PO TABS: 1.0000 mg | ORAL_TABLET | Freq: Two times a day (BID) | ORAL | 0 refills | Status: DC | PRN

## 2020-03-19 MED ORDER — ALPRAZOLAM 0.5 MG PO TABS: 0.5000 mg | ORAL_TABLET | Freq: Two times a day (BID) | ORAL | 0 refills | Status: DC | PRN

## 2020-03-20 ENCOUNTER — Telehealth: Payer: Self-pay | Admitting: Registered Nurse

## 2020-03-20 NOTE — Telephone Encounter (Signed)
Pharmacy called asking if he was to get 1mg  AND .5 mg xanax. Informed only supposed to be 1 mg as pt previous dose

## 2020-03-22 ENCOUNTER — Encounter: Payer: Self-pay | Admitting: Registered Nurse
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
## 2020-04-20 ENCOUNTER — Other Ambulatory Visit: Payer: Self-pay | Admitting: Registered Nurse

## 2020-04-21 MED ORDER — ALPRAZOLAM 1 MG PO TABS: 1.0000 mg | ORAL_TABLET | Freq: Two times a day (BID) | ORAL | 0 refills | Status: DC | PRN

## 2020-04-21 NOTE — Telephone Encounter (Signed)
Patient is requesting a refill of the following medications: Requested Prescriptions   Pending Prescriptions Disp Refills   ALPRAZolam (XANAX) 1 MG tablet 60 tablet 0    Sig: Take 1 tablet (1 mg total) by mouth 2 (two) times daily as needed for anxiety.    Date of patient request: 04/20/20 Last office visit: 01/12/20 Date of last refill: 03/19/20 Last refill amount: 60 Follow up time period per chart: none

## 2020-05-17 ENCOUNTER — Other Ambulatory Visit: Payer: Self-pay | Admitting: Registered Nurse

## 2020-05-17 DIAGNOSIS — F5221 Male erectile disorder: Secondary | ICD-10-CM

## 2020-05-18 NOTE — Telephone Encounter (Signed)
Patient is requesting a refill of the following medications: Requested Prescriptions   Pending Prescriptions Disp Refills   tadalafil (CIALIS) 20 MG tablet 30 tablet 0    Sig: Take 1 tablet (20 mg total) by mouth daily as needed for erectile dysfunction.   ALPRAZolam (XANAX) 1 MG tablet 60 tablet 0    Sig: Take 1 tablet (1 mg total) by mouth 2 (two) times daily as needed for anxiety.    Date of patient request: 05/17/20 Last office visit: 01/12/20 Date of last refill: 03/19/20 and 10/01/19 Last refill amount: 90 and 10/01/19 Follow up time period per chart:

## 2020-05-19 MED ORDER — ALPRAZOLAM 1 MG PO TABS: 1.0000 mg | ORAL_TABLET | Freq: Two times a day (BID) | ORAL | 0 refills | Status: DC | PRN

## 2020-05-19 MED ORDER — TADALAFIL 20 MG PO TABS: 20.0000 mg | ORAL_TABLET | Freq: Every day | ORAL | 0 refills | Status: AC | PRN

## 2020-06-08 NOTE — Telephone Encounter (Signed)
Opened in error

## 2020-06-20 ENCOUNTER — Other Ambulatory Visit: Payer: Self-pay | Admitting: Registered Nurse

## 2020-06-20 NOTE — Telephone Encounter (Signed)
Patient is requesting a refill of the following medications: Requested Prescriptions   Pending Prescriptions Disp Refills  . ALPRAZolam (XANAX) 1 MG tablet 60 tablet 0    Sig: Take 1 tablet (1 mg total) by mouth 2 (two) times daily as needed for anxiety.    Date of patient request: 06/20/2020 Last office visit: 01/12/2020 Date of last refill:05/19/2020 Last refill amount: 60 tablets  Follow up time period per chart: none

## 2020-06-21 MED ORDER — ALPRAZOLAM 1 MG PO TABS: 1.0000 mg | ORAL_TABLET | Freq: Two times a day (BID) | ORAL | 0 refills | Status: DC | PRN

## 2020-07-12 ENCOUNTER — Other Ambulatory Visit: Payer: Self-pay | Admitting: Registered Nurse

## 2020-07-12 DIAGNOSIS — F411 Generalized anxiety disorder: Secondary | ICD-10-CM

## 2020-07-18 NOTE — Telephone Encounter (Signed)
Patient is requesting a refill of the following medications: Requested Prescriptions   Pending Prescriptions Disp Refills   venlafaxine XR (EFFEXOR XR) 150 MG 24 hr capsule 90 capsule 1    Sig: Take 1 capsule (150 mg total) by mouth daily with breakfast.   ALPRAZolam (XANAX) 1 MG tablet 60 tablet 0    Sig: Take 1 tablet (1 mg total) by mouth 2 (two) times daily as needed for anxiety.    Date of patient request: 07/12/2020 Last office visit: 01/11/21 Date of last refill: 08/20/19, 06/21/20 Last refill amount: 90 1R, 60 0R

## 2020-07-19 MED ORDER — ALPRAZOLAM 1 MG PO TABS
1.0000 mg | ORAL_TABLET | Freq: Two times a day (BID) | ORAL | 0 refills | Status: DC | PRN
Start: 2020-07-19 — End: 2020-08-18

## 2020-07-19 MED ORDER — VENLAFAXINE HCL ER 150 MG PO CP24: 150.0000 mg | ORAL_CAPSULE | Freq: Every day | ORAL | 1 refills | Status: AC

## 2020-08-16 ENCOUNTER — Other Ambulatory Visit: Payer: Self-pay | Admitting: Registered Nurse

## 2020-08-16 DIAGNOSIS — F411 Generalized anxiety disorder: Secondary | ICD-10-CM

## 2020-08-18 ENCOUNTER — Other Ambulatory Visit: Payer: Self-pay | Admitting: Registered Nurse

## 2020-08-18 MED ORDER — ALPRAZOLAM 1 MG PO TABS: 1.0000 mg | ORAL_TABLET | Freq: Two times a day (BID) | ORAL | 0 refills | Status: DC | PRN

## 2020-08-18 NOTE — Telephone Encounter (Signed)
LFD 07/19/20 #60 with no refills LOV 01/12/20 NOV none

## 2020-09-19 ENCOUNTER — Other Ambulatory Visit: Payer: Self-pay | Admitting: Registered Nurse

## 2020-09-19 DIAGNOSIS — F411 Generalized anxiety disorder: Secondary | ICD-10-CM

## 2020-09-19 MED ORDER — ALPRAZOLAM 1 MG PO TABS: 1.0000 mg | ORAL_TABLET | Freq: Two times a day (BID) | ORAL | 0 refills | Status: DC | PRN

## 2020-09-19 MED ORDER — BUSPIRONE HCL 5 MG PO TABS: 5.0000 mg | ORAL_TABLET | Freq: Two times a day (BID) | ORAL | 0 refills | Status: DC

## 2020-09-25 ENCOUNTER — Encounter: Payer: Self-pay | Admitting: Registered Nurse

## 2020-09-25 ENCOUNTER — Other Ambulatory Visit: Payer: Self-pay | Admitting: Registered Nurse

## 2020-09-25 DIAGNOSIS — R454 Irritability and anger: Secondary | ICD-10-CM

## 2020-09-25 DIAGNOSIS — F411 Generalized anxiety disorder: Secondary | ICD-10-CM

## 2020-09-25 MED ORDER — ALPRAZOLAM 1 MG PO TABS: 1.0000 mg | ORAL_TABLET | Freq: Two times a day (BID) | ORAL | 0 refills | Status: DC | PRN

## 2020-10-03 ENCOUNTER — Other Ambulatory Visit: Payer: Self-pay | Admitting: Registered Nurse

## 2020-10-03 DIAGNOSIS — F411 Generalized anxiety disorder: Secondary | ICD-10-CM

## 2020-11-03 ENCOUNTER — Other Ambulatory Visit: Payer: Self-pay | Admitting: Registered Nurse

## 2020-11-03 DIAGNOSIS — R454 Irritability and anger: Secondary | ICD-10-CM

## 2020-11-03 DIAGNOSIS — F411 Generalized anxiety disorder: Secondary | ICD-10-CM

## 2020-11-06 MED ORDER — ALPRAZOLAM 1 MG PO TABS: 1.0000 mg | ORAL_TABLET | Freq: Two times a day (BID) | ORAL | 0 refills | Status: DC | PRN

## 2020-11-06 NOTE — Telephone Encounter (Signed)
Patient is requesting a refill of the following medications: Requested Prescriptions   Pending Prescriptions Disp Refills   ALPRAZolam (XANAX) 1 MG tablet 60 tablet 0    Sig: Take 1 tablet (1 mg total) by mouth 2 (two) times daily as needed for anxiety.    Date of patient request: 11/04/2020 Last office visit: 01/12/2020 Date of last refill: 09/25/2020 Last refill amount: 60 tablet Follow up time period per chart: n/a

## 2020-12-06 ENCOUNTER — Other Ambulatory Visit: Payer: Self-pay | Admitting: Registered Nurse

## 2020-12-06 DIAGNOSIS — R454 Irritability and anger: Secondary | ICD-10-CM

## 2020-12-06 DIAGNOSIS — F411 Generalized anxiety disorder: Secondary | ICD-10-CM

## 2020-12-06 MED ORDER — ALPRAZOLAM 1 MG PO TABS: 1.0000 mg | ORAL_TABLET | Freq: Two times a day (BID) | ORAL | 0 refills | Status: AC | PRN

## 2020-12-06 NOTE — Telephone Encounter (Signed)
Patient is requesting a refill of the following medications: Requested Prescriptions   Pending Prescriptions Disp Refills   ALPRAZolam (XANAX) 1 MG tablet 60 tablet 0    Sig: Take 1 tablet (1 mg total) by mouth 2 (two) times daily as needed for anxiety.    Date of patient request: 12/04/2020 Last office visit: 01/12/2020 Date of last refill: 11/06/2020 Last refill amount: 60 tablets Follow up time period per chart:

## 2021-01-05 ENCOUNTER — Other Ambulatory Visit: Payer: Self-pay | Admitting: Registered Nurse

## 2021-01-05 DIAGNOSIS — F411 Generalized anxiety disorder: Secondary | ICD-10-CM

## 2021-01-05 DIAGNOSIS — R454 Irritability and anger: Secondary | ICD-10-CM

## 2021-01-08 ENCOUNTER — Encounter: Payer: Self-pay | Admitting: Registered Nurse

## 2021-01-08 MED ORDER — BUSPIRONE HCL 5 MG PO TABS: 5.0000 mg | ORAL_TABLET | Freq: Two times a day (BID) | ORAL | 0 refills | Status: AC

## 2021-01-08 NOTE — Telephone Encounter (Signed)
Needs OV for med check  Thanks,  Luan Pulling

## 2021-01-08 NOTE — Telephone Encounter (Signed)
Patient is requesting a refill of the following medications: Requested Prescriptions   Pending Prescriptions Disp Refills   ALPRAZolam (XANAX) 1 MG tablet 60 tablet 0    Sig: Take 1 tablet (1 mg total) by mouth 2 (two) times daily as needed for anxiety.    Date of patient request: 01/05/2021 Last office visit: 01/12/2020 Date of last refill: 12/06/2020 Last refill amount: 60 tablets  Follow up time period per chart: n/a

## 2022-01-29 ENCOUNTER — Other Ambulatory Visit: Payer: Self-pay

## 2022-01-29 ENCOUNTER — Emergency Department
Admission: EM | Admit: 2022-01-29 | Discharge: 2022-01-29 | Disposition: A | Discharge status: Home / Self Care | Payer: Self-pay | Attending: Emergency Medicine | Admitting: Emergency Medicine

## 2022-01-29 ENCOUNTER — Emergency Department: Payer: Self-pay

## 2022-01-29 DIAGNOSIS — M79632 Pain in left forearm: Secondary | ICD-10-CM | POA: Diagnosis present

## 2022-01-29 DIAGNOSIS — S5002XA Contusion of left elbow, initial encounter: Secondary | ICD-10-CM | POA: Insufficient documentation

## 2022-01-29 MED ORDER — NAPROXEN 500 MG PO TABS: 500.0000 mg | ORAL_TABLET | Freq: Two times a day (BID) | ORAL | 2 refills | Status: DC
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
## 2022-01-29 NOTE — ED Provider Notes (Signed)
duplicate   Jene Every, MD 02/02/22 1415
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
## 2023-02-19 ENCOUNTER — Emergency Department
Admission: EM | Admit: 2023-02-19 | Discharge: 2023-02-19 | Disposition: A | Discharge status: Home / Self Care | Payer: BLUE CROSS/BLUE SHIELD | Attending: Emergency Medicine | Admitting: Emergency Medicine

## 2023-02-19 ENCOUNTER — Other Ambulatory Visit: Payer: Self-pay

## 2023-02-19 DIAGNOSIS — J029 Acute pharyngitis, unspecified: Secondary | ICD-10-CM | POA: Insufficient documentation

## 2023-02-19 DIAGNOSIS — Z20822 Contact with and (suspected) exposure to covid-19: Secondary | ICD-10-CM | POA: Insufficient documentation

## 2023-02-19 LAB — GROUP A STREP BY PCR: Group A Strep by PCR: NOT DETECTED

## 2023-02-19 LAB — SARS CORONAVIRUS 2 BY RT PCR: SARS Coronavirus 2 by RT PCR: NEGATIVE

## 2023-02-19 MED ORDER — AMOXICILLIN 500 MG PO CAPS: 500.0000 mg | ORAL_CAPSULE | Freq: Three times a day (TID) | ORAL | 0 refills | Status: AC

## 2023-02-19 MED ORDER — AMOXICILLIN 500 MG PO CAPS
500.0000 mg | ORAL_CAPSULE | Freq: Once | ORAL | Status: AC
  Administered 2023-02-19: 500 mg via ORAL
  Filled 2023-02-19: qty 1

## 2023-02-19 NOTE — ED Provider Notes (Cosign Needed)
[facility] Provider Note    Event Date/Time   First MD Initiated Contact with Patient 02/19/23 [phone]     (approximate)   History   Sore Throat   HPI  Mark Holt is a 32 y.o. male with no significant past medical history presents emergency department with sore throat and swelling.  Patient states been sore for several days.  Having difficulty swallowing liquids secondary discomfort.  No known exposure to strep throat.  No exposure to mono.  Denies fever or chills      Physical Exam   Triage Vital Signs: ED Triage Vitals  Encounter Vitals Group     BP 02/19/23 0856 (!) 156/96     Systolic BP Percentile --      Diastolic BP Percentile --      Pulse Rate 02/19/23 0856 94     Resp 02/19/23 0856 16     Temp 02/19/23 0856 98 F (36.7 C)     Temp Source 02/19/23 0856 Oral     SpO2 02/19/23 0856 98 %     Weight 02/19/23 0856 219 lb (99.3 kg)     Height 02/19/23 0856 6' (1.829 m)     Head Circumference --      Peak Flow --      Pain Score 02/19/23 0857 5     Pain Loc --      Pain Education --      Exclude from Growth Chart --     Most recent vital signs: Vitals:   02/19/23 0856  BP: (!) 156/96  Pulse: 94  Resp: 16  Temp: 98 F (36.7 C)  SpO2: 98%     General: Awake, no distress.   CV:  Good peripheral perfusion. regular rate and  rhythm Resp:  Normal effort. Lungs CTA Abd:  No distention.   Other:  ENT: TMs clear bilaterally, neck is supple, no lymphadenopathy, throat is bright red and swollen, does have a strep like odor   ED Results / Procedures / Treatments   Labs (all labs ordered are listed, but only abnormal results are displayed) Labs Reviewed  GROUP A STREP BY PCR  SARS CORONAVIRUS 2 BY RT PCR     EKG     RADIOLOGY     PROCEDURES:   Procedures   MEDICATIONS ORDERED IN ED: Medications  amoxicillin (AMOXIL) capsule 500 mg (has no administration in time range)     IMPRESSION / MDM / ASSESSMENT AND  PLAN / ED COURSE  I reviewed the triage vital signs and the nursing notes.                              Differential diagnosis includes, but is not limited to, COVID, strep throat, mono  Patient's presentation is most consistent with acute illness / injury with system symptoms.   Due to the appearance of the throat along with the odor we will go ahead and treat the patient with amoxicillin.  COVID and strep test were reassuring  I did explain the findings to the patient.  He is to follow-up with his regular doctor if not improving in 3 days.  Return if worsening.  Gargle with warm salt water.  Patient is in agreement treatment plan.  He was discharged stable condition.      FINAL CLINICAL IMPRESSION(S) / ED DIAGNOSES   Final diagnoses:  Acute pharyngitis, unspecified etiology     Rx /  DC Orders   ED Discharge Orders          Ordered    amoxicillin (AMOXIL) 500 MG capsule  3 times daily        02/19/23 0950             Note:  This document was prepared using Dragon voice recognition software and may include unintentional dictation errors.    Faythe Ghee, PA-C 02/19/23 1044

## 2023-02-19 NOTE — ED Triage Notes (Signed)
Pt to ED for severe sore throat since 1 week. States painful to swallow. Now L ear is hurting.

## 2023-02-19 NOTE — ED Notes (Signed)
See triage note  Presents with sore throat for several days  then developed left ear pain yesterday  afebrile on arrival

## 2023-11-06 ENCOUNTER — Ambulatory Visit: Payer: Self-pay | Admitting: Pediatrics
